# Patient Record
Sex: Male | Born: 1955
Health system: Southern US, Community
[De-identification: ages and names within clinical notes are randomized; demographics above are authoritative.]

## PROBLEM LIST (undated history)

## (undated) DIAGNOSIS — M069 Rheumatoid arthritis, unspecified: Secondary | ICD-10-CM

## (undated) DIAGNOSIS — E785 Hyperlipidemia, unspecified: Secondary | ICD-10-CM

## (undated) DIAGNOSIS — M1990 Unspecified osteoarthritis, unspecified site: Secondary | ICD-10-CM

## (undated) DIAGNOSIS — H9192 Unspecified hearing loss, left ear: Secondary | ICD-10-CM

## (undated) DIAGNOSIS — T4145XA Adverse effect of unspecified anesthetic, initial encounter: Secondary | ICD-10-CM

## (undated) DIAGNOSIS — I252 Old myocardial infarction: Secondary | ICD-10-CM

## (undated) DIAGNOSIS — E669 Obesity, unspecified: Secondary | ICD-10-CM

## (undated) DIAGNOSIS — K829 Disease of gallbladder, unspecified: Secondary | ICD-10-CM

## (undated) DIAGNOSIS — M549 Dorsalgia, unspecified: Secondary | ICD-10-CM

## (undated) DIAGNOSIS — I10 Essential (primary) hypertension: Secondary | ICD-10-CM

## (undated) DIAGNOSIS — Z91018 Allergy to other foods: Secondary | ICD-10-CM

## (undated) DIAGNOSIS — T8859XA Other complications of anesthesia, initial encounter: Secondary | ICD-10-CM

## (undated) DIAGNOSIS — G4733 Obstructive sleep apnea (adult) (pediatric): Secondary | ICD-10-CM

## (undated) DIAGNOSIS — Z9989 Dependence on other enabling machines and devices: Secondary | ICD-10-CM

## (undated) DIAGNOSIS — S22089A Unspecified fracture of T11-T12 vertebra, initial encounter for closed fracture: Secondary | ICD-10-CM

## (undated) DIAGNOSIS — M2550 Pain in unspecified joint: Secondary | ICD-10-CM

## (undated) DIAGNOSIS — K219 Gastro-esophageal reflux disease without esophagitis: Secondary | ICD-10-CM

## (undated) DIAGNOSIS — I2510 Atherosclerotic heart disease of native coronary artery without angina pectoris: Secondary | ICD-10-CM

## (undated) HISTORY — DX: Pain in unspecified joint: M25.50

## (undated) HISTORY — PX: TOTAL HIP ARTHROPLASTY: SHX124

## (undated) HISTORY — DX: Allergy to other foods: Z91.018

## (undated) HISTORY — DX: Hyperlipidemia, unspecified: E78.5

## (undated) HISTORY — DX: Rheumatoid arthritis, unspecified: M06.9

## (undated) HISTORY — DX: Essential (primary) hypertension: I10

## (undated) HISTORY — DX: Old myocardial infarction: I25.2

## (undated) HISTORY — DX: Obesity, unspecified: E66.9

## (undated) HISTORY — DX: Dependence on other enabling machines and devices: Z99.89

## (undated) HISTORY — DX: Dorsalgia, unspecified: M54.9

## (undated) HISTORY — DX: Gastro-esophageal reflux disease without esophagitis: K21.9

## (undated) HISTORY — DX: Unspecified fracture of t11-T12 vertebra, initial encounter for closed fracture: S22.089A

## (undated) HISTORY — PX: CORONARY ANGIOPLASTY: SHX604

## (undated) HISTORY — DX: Disease of gallbladder, unspecified: K82.9

## (undated) HISTORY — DX: Atherosclerotic heart disease of native coronary artery without angina pectoris: I25.10

## (undated) HISTORY — DX: Obstructive sleep apnea (adult) (pediatric): G47.33

## (undated) HISTORY — PX: CARDIAC CATHETERIZATION: SHX172

## (undated) HISTORY — PX: TONSILLECTOMY: SUR1361

## (undated) HISTORY — DX: Unspecified hearing loss, left ear: H91.92

---

## 2004-02-20 ENCOUNTER — Encounter: Admission: RE | Admit: 2004-02-20 | Discharge: 2004-02-20 | Payer: Self-pay | Admitting: Family Medicine

## 2006-11-08 ENCOUNTER — Encounter: Admission: RE | Admit: 2006-11-08 | Discharge: 2006-11-08 | Payer: Self-pay | Admitting: Rheumatology

## 2006-11-23 ENCOUNTER — Encounter: Admission: RE | Admit: 2006-11-23 | Discharge: 2006-11-23 | Payer: Self-pay | Admitting: Orthopaedic Surgery

## 2007-04-19 ENCOUNTER — Encounter: Admission: RE | Admit: 2007-04-19 | Discharge: 2007-04-19 | Payer: Self-pay | Admitting: Family Medicine

## 2008-06-13 ENCOUNTER — Observation Stay (HOSPITAL_COMMUNITY): Admission: AD | Admit: 2008-06-13 | Discharge: 2008-06-14 | Payer: Self-pay | Admitting: Interventional Cardiology

## 2008-06-27 ENCOUNTER — Inpatient Hospital Stay (HOSPITAL_COMMUNITY): Admission: AD | Admit: 2008-06-27 | Discharge: 2008-06-28 | Payer: Self-pay | Admitting: Interventional Cardiology

## 2010-01-30 ENCOUNTER — Emergency Department (HOSPITAL_COMMUNITY): Admission: EM | Admit: 2010-01-30 | Discharge: 2010-01-30 | Payer: Self-pay | Admitting: Emergency Medicine

## 2011-01-08 ENCOUNTER — Encounter: Payer: Self-pay | Admitting: Family Medicine

## 2011-01-09 ENCOUNTER — Encounter: Payer: Self-pay | Admitting: Family Medicine

## 2011-09-15 LAB — BASIC METABOLIC PANEL
CO2: 26
CO2: 26
Calcium: 8.7
Calcium: 8.8
Chloride: 105
Chloride: 106
GFR calc Af Amer: >60
Glucose, Bld: 106 — ABNORMAL HIGH
Sodium: 140
Sodium: 137

## 2011-09-15 LAB — CBC
Hemoglobin: 13.9
Hemoglobin: 13.4
MCHC: 33.6
MCHC: 33.9
MCV: 87.4
MCV: 87.6
RBC: 4.75
RDW: 13

## 2012-04-19 ENCOUNTER — Other Ambulatory Visit: Payer: Self-pay | Admitting: Family Medicine

## 2012-04-19 DIAGNOSIS — M25552 Pain in left hip: Secondary | ICD-10-CM

## 2012-04-27 ENCOUNTER — Ambulatory Visit
Admission: RE | Admit: 2012-04-27 | Discharge: 2012-04-27 | Disposition: A | Discharge status: Home / Self Care | Payer: 59 | Source: Ambulatory Visit | Attending: Family Medicine | Admitting: Family Medicine

## 2012-04-27 DIAGNOSIS — M25552 Pain in left hip: Secondary | ICD-10-CM

## 2012-04-27 MED ORDER — IOHEXOL 180 MG/ML  SOLN: 1.0000 mL | Freq: Once | INTRAMUSCULAR | Status: AC | PRN

## 2012-04-27 MED ORDER — METHYLPREDNISOLONE ACETATE 40 MG/ML INJ SUSP (RADIOLOG: 120.0000 mg | Freq: Once | INTRAMUSCULAR | Status: DC

## 2013-09-03 ENCOUNTER — Other Ambulatory Visit: Payer: Self-pay | Admitting: Cardiology

## 2013-09-03 ENCOUNTER — Other Ambulatory Visit: Payer: Self-pay | Admitting: Interventional Cardiology

## 2013-09-03 DIAGNOSIS — E782 Mixed hyperlipidemia: Secondary | ICD-10-CM

## 2013-09-03 DIAGNOSIS — Z79899 Other long term (current) drug therapy: Secondary | ICD-10-CM

## 2013-09-23 ENCOUNTER — Other Ambulatory Visit (HOSPITAL_BASED_OUTPATIENT_CLINIC_OR_DEPARTMENT_OTHER): Payer: Self-pay | Admitting: Family Medicine

## 2013-09-23 DIAGNOSIS — R1013 Epigastric pain: Secondary | ICD-10-CM

## 2013-09-25 ENCOUNTER — Ambulatory Visit (HOSPITAL_BASED_OUTPATIENT_CLINIC_OR_DEPARTMENT_OTHER): Payer: 59

## 2013-10-02 ENCOUNTER — Ambulatory Visit (HOSPITAL_BASED_OUTPATIENT_CLINIC_OR_DEPARTMENT_OTHER)
Admission: RE | Admit: 2013-10-02 | Discharge: 2013-10-02 | Disposition: A | Discharge status: Home / Self Care | Payer: 59 | Source: Ambulatory Visit | Attending: Family Medicine | Admitting: Family Medicine

## 2013-10-02 DIAGNOSIS — R1013 Epigastric pain: Secondary | ICD-10-CM

## 2013-11-28 ENCOUNTER — Other Ambulatory Visit: Payer: Self-pay | Admitting: Interventional Cardiology

## 2013-12-03 ENCOUNTER — Other Ambulatory Visit: Payer: 59

## 2014-02-01 ENCOUNTER — Other Ambulatory Visit: Payer: Self-pay | Admitting: Interventional Cardiology

## 2014-04-21 ENCOUNTER — Ambulatory Visit (INDEPENDENT_AMBULATORY_CARE_PROVIDER_SITE_OTHER): Payer: 59 | Admitting: Interventional Cardiology

## 2014-04-21 ENCOUNTER — Encounter: Payer: Self-pay | Admitting: Interventional Cardiology

## 2014-04-21 ENCOUNTER — Telehealth: Payer: Self-pay | Admitting: Pharmacist

## 2014-04-21 VITALS — BP 152/90 | HR 60 | Ht 67.0 in | Wt 220.0 lb

## 2014-04-21 DIAGNOSIS — I2510 Atherosclerotic heart disease of native coronary artery without angina pectoris: Secondary | ICD-10-CM

## 2014-04-21 DIAGNOSIS — E669 Obesity, unspecified: Secondary | ICD-10-CM

## 2014-04-21 DIAGNOSIS — I10 Essential (primary) hypertension: Secondary | ICD-10-CM

## 2014-04-21 DIAGNOSIS — E782 Mixed hyperlipidemia: Secondary | ICD-10-CM

## 2014-04-21 NOTE — Progress Notes (Signed)
Patient ID: Brendan Cox, male   DOB: 06/05/1956, [age] y.o.   MRN: [id number]    [address] [place], [place]  [address] Phone: [phone] Fax:  [phone]  Date:  04/21/2014   ID:  Brendan Cox, DOB 02/25/1956, MRN [id number]  PCP:  Lenora BoysFRIED, ROBERT L, MD      History of Present Illness: Brendan Cox is a 58 y.o. male who has had CAD, He had a DES to the LAD in 7/09. He had been walking 5 x/week, for 7000-8000 steps. Arthiritis limits him somewhat and then he had a hip replacement- with some complications postoperatively. He had lost weight through diet control but has gained weight. He is eating more regularly. He tries to eat sandwiches when he is traveling and avoids fried foods. He avoids red meat and processed meat. He has decreased the quantity of his junk food. He is having his hip replaced. BP at home is 128-132 systolic. CAD/ASCVD:  Denies : Chest pain.  Dizziness.  Dyspnea on exertion.  Leg edema.  Nitroglycerin.  Palpitations.  Paroxysmal nocturnal dyspnea.  Syncope.    Had an episode of stomach while mowing the lawn but this resolved with some Mallox.  He has mowed the lawn since then several times without any sx.  Wt Readings from Last 3 Encounters:  04/21/14 220 lb (99.791 kg)     Past Medical History  Diagnosis Date  . Coronary artery disease   . Hearing loss in left ear   . Hyperlipidemia   . Hypertension   . Obesity     Current Outpatient Prescriptions  Medication Sig Dispense Refill  . aspirin 81 MG tablet Take 81 mg by mouth daily.      Marland Kitchen. azelastine (ASTELIN) 137 MCG/SPRAY nasal spray Place 2 sprays into both nostrils 2 (two) times daily. Use in each nostril as directed      . clopidogrel (PLAVIX) 75 MG tablet TAKE 1 TABLET DAILY  90 tablet  1  . co-enzyme Q-10 30 MG capsule Take 200 mg by mouth daily.      . hydrochlorothiazide (HYDRODIURIL) 25 MG tablet Take 25 mg by mouth daily.      Marland Kitchen. HYDROcodone-acetaminophen (NORCO/VICODIN)  5-325 MG per tablet Take 1 tablet by mouth as needed for moderate pain.      . hydrocortisone-pramoxine (ANALPRAM-HC) 2.5-1 % rectal cream Place 1 application rectally 3 (three) times daily.      Marland Kitchen. METOPROLOL SUCCINATE ER PO Take 50 mg by mouth daily.      . montelukast (SINGULAIR) 10 MG tablet Take 10 mg by mouth as needed.      . nitroGLYCERIN (NITROSTAT) 0.4 MG SL tablet Place 0.4 mg under the tongue every 5 (five) minutes as needed for chest pain.      . pantoprazole (PROTONIX) 40 MG tablet TAKE 1 TABLET DAILY  90 tablet  1  . pramoxine-hydrocortisone (ANALPRAM HC) cream Apply topically as needed.      . ranitidine (ZANTAC) 300 MG capsule Take 300 mg by mouth every evening.      . zolpidem (AMBIEN) 10 MG tablet Take 10 mg by mouth at bedtime as needed for sleep.       No current facility-administered medications for this visit.    Allergies:    Allergies  Allergen Reactions  . Diclofenac-Misoprostol Nausea And Vomiting and Nausea Only  . Meperidine Nausea And Vomiting  . Statins      Patient stated that statin  drugs gives him muscle pain and joint pain  . Zetia [Ezetimibe]     Muscle cramps     Social History:  The patient  reports that he has never smoked. He does not have any smokeless tobacco history on file. He reports that he drinks about .6 ounces of alcohol per week. He reports that he does not use illicit drugs.   Family History:  The patient's family history includes Heart disease in his mother.   ROS:  Please see the history of present illness.  No nausea, vomiting.  No fevers, chills.  No focal weakness.  No dysuria. Gaining strength.   All other systems reviewed and negative.   PHYSICAL EXAM: VS:  BP 152/90  Pulse 60  Ht 5\' 7"  (1.702 m)  Wt 220 lb (99.791 kg)  BMI 34.45 kg/m2 Well nourished, well developed, in no acute distress HEENT: normal Neck: no JVD, no carotid bruits Cardiac:  normal S1, S2; RRR;  Lungs:  clear to auscultation bilaterally, no wheezing,  rhonchi or rales Abd: soft, nontender, no hepatomegaly Ext: no edema Skin: warm and dry Neuro:   no focal abnormalities noted  EKG:  NSR, no significant ST segment changes  ; no change from prior noted below  ASSESSMENT AND PLAN:  Coronary atherosclerosis of native coronary artery  Continue Aspirin Tablet, 81 MG, 1 tab, Orally, Once a day Continue Plavix Tablet, 75 MG, 1 tablet, Orally, Once a day IMAGING: EKG    Harward,Amy 04/22/2013 11:01:23 AM > Cecylia Brazill,JAY 04/22/2013 11:35:18 AM > NSR, no ST segment changes   Notes: No angina.    2. Obesity, unspecified  Notes: lost weight with diet control. Trying to get under 200 lbs.  gained a little bit of weight after hip surgery.  Increase exercise as tolerated.   3. Essential hypertension, benign  Notes: Controlled at home. Continue current medicines.    4. Hyperlipidemia, mixed  Notes: Intolerant of welchol. LDL target < 100.  Intolerant of several statins. He was recently started on said he had but stopped this because of muscle pain. He could be a candidate for a PC SK 9 inhibitor trial. Will refer to our Pharm.D.    Preventive Medicine  Adult topics discussed:  Diet: healthy diet, low calorie, low fat.  Exercise: 5 days a week, at least 30 minutes of aerobic exercise.      Signed, Fredric MareJay S. Keylin Podolsky, MD, [facility] 04/21/2014 10:10 AM

## 2014-04-21 NOTE — Telephone Encounter (Signed)
Patient has a h/o CAD (PCI 2009) and intolerant to multiple statins, including Livalo, Crestor, Zocor, pravastatin, and also failed Welchol and Zetia due to muscle aches.  His LDL is ~ 190 mg/dL at baseline.  I discussed PCSK-9 inhibitors with patient, and he would like to get on Amgen or Sanofi's once they hit the market in the next few months.  I will call patient in a few months once they hit the market, and hopefully get him started at that time.

## 2014-04-21 NOTE — Patient Instructions (Signed)
Your physician recommends that you continue on your current medications as directed. Please refer to the Current Medication list given to you today.  Your physician wants you to follow-up in: 1 year with Dr. Varanasi. You will receive a reminder letter in the mail two months in advance. If you don't receive a letter, please call our office to schedule the follow-up appointment.  

## 2014-04-25 ENCOUNTER — Encounter: Payer: Self-pay | Admitting: Interventional Cardiology

## 2014-05-04 ENCOUNTER — Other Ambulatory Visit: Payer: Self-pay | Admitting: Interventional Cardiology

## 2014-07-10 ENCOUNTER — Other Ambulatory Visit: Payer: Self-pay | Admitting: Interventional Cardiology

## 2014-09-18 DIAGNOSIS — Z9989 Dependence on other enabling machines and devices: Secondary | ICD-10-CM

## 2014-09-18 DIAGNOSIS — G4733 Obstructive sleep apnea (adult) (pediatric): Secondary | ICD-10-CM

## 2014-09-18 HISTORY — DX: Obstructive sleep apnea (adult) (pediatric): Z99.89

## 2014-09-18 HISTORY — DX: Obstructive sleep apnea (adult) (pediatric): G47.33

## 2014-10-19 DIAGNOSIS — S22089A Unspecified fracture of T11-T12 vertebra, initial encounter for closed fracture: Secondary | ICD-10-CM

## 2014-10-19 HISTORY — DX: Unspecified fracture of t11-T12 vertebra, initial encounter for closed fracture: S22.089A

## 2014-10-31 ENCOUNTER — Other Ambulatory Visit: Payer: Self-pay | Admitting: Interventional Cardiology

## 2014-11-19 ENCOUNTER — Other Ambulatory Visit: Payer: Self-pay | Admitting: Family Medicine

## 2014-11-19 DIAGNOSIS — W11XXXA Fall on and from ladder, initial encounter: Secondary | ICD-10-CM

## 2014-11-19 DIAGNOSIS — S300XXA Contusion of lower back and pelvis, initial encounter: Secondary | ICD-10-CM

## 2014-11-24 ENCOUNTER — Ambulatory Visit (INDEPENDENT_AMBULATORY_CARE_PROVIDER_SITE_OTHER): Payer: 59

## 2014-11-24 DIAGNOSIS — M4802 Spinal stenosis, cervical region: Secondary | ICD-10-CM

## 2014-11-24 DIAGNOSIS — W11XXXD Fall on and from ladder, subsequent encounter: Secondary | ICD-10-CM

## 2014-11-24 DIAGNOSIS — W11XXXA Fall on and from ladder, initial encounter: Secondary | ICD-10-CM

## 2014-11-24 DIAGNOSIS — S300XXA Contusion of lower back and pelvis, initial encounter: Secondary | ICD-10-CM

## 2014-11-24 DIAGNOSIS — M5136 Other intervertebral disc degeneration, lumbar region: Secondary | ICD-10-CM

## 2014-11-24 DIAGNOSIS — M4806 Spinal stenosis, lumbar region: Secondary | ICD-10-CM

## 2014-11-24 DIAGNOSIS — S22080A Wedge compression fracture of T11-T12 vertebra, initial encounter for closed fracture: Secondary | ICD-10-CM

## 2014-11-24 DIAGNOSIS — M5022 Other cervical disc displacement, mid-cervical region: Secondary | ICD-10-CM

## 2014-11-24 DIAGNOSIS — M5126 Other intervertebral disc displacement, lumbar region: Secondary | ICD-10-CM

## 2015-04-06 ENCOUNTER — Other Ambulatory Visit: Payer: Self-pay | Admitting: Interventional Cardiology

## 2015-05-04 ENCOUNTER — Encounter: Payer: Self-pay | Admitting: Interventional Cardiology

## 2015-05-04 ENCOUNTER — Ambulatory Visit (INDEPENDENT_AMBULATORY_CARE_PROVIDER_SITE_OTHER): Payer: 59 | Admitting: Interventional Cardiology

## 2015-05-04 VITALS — BP 144/86 | HR 62 | Ht 67.5 in | Wt 220.8 lb

## 2015-05-04 DIAGNOSIS — E669 Obesity, unspecified: Secondary | ICD-10-CM

## 2015-05-04 DIAGNOSIS — I25119 Atherosclerotic heart disease of native coronary artery with unspecified angina pectoris: Secondary | ICD-10-CM

## 2015-05-04 DIAGNOSIS — I10 Essential (primary) hypertension: Secondary | ICD-10-CM

## 2015-05-04 DIAGNOSIS — I2510 Atherosclerotic heart disease of native coronary artery without angina pectoris: Secondary | ICD-10-CM | POA: Diagnosis not present

## 2015-05-04 DIAGNOSIS — E782 Mixed hyperlipidemia: Secondary | ICD-10-CM | POA: Diagnosis not present

## 2015-05-04 MED ORDER — CLOPIDOGREL BISULFATE 75 MG PO TABS: 75.0000 mg | ORAL_TABLET | Freq: Every day | ORAL | Status: DC

## 2015-05-04 NOTE — Patient Instructions (Signed)
Medication Instructions: - none  Labwork: - none  Procedures/Testing: - Your physician has requested that you have an exercise stress myoview. Please call our office back at [phone] when you are ready to schedule this.   Follow-Up: Your physician wants you to follow-up in: 4 months with Dr. Eldridge DaceVaranasi. You will receive a reminder letter in the mail two months in advance. If you don't receive a letter, please call our office to schedule the follow-up appointment.  Any Additional Special Instructions Will Be Listed Below (If Applicable). - none

## 2015-05-04 NOTE — Progress Notes (Signed)
Patient ID: Brendan Cox, male   DOB: 04/16/1956, 59 y.o.   MRN: [id number]     Cardiology Office Note   Date:  05/04/2015   ID:  Brendan Cox, DOB 11/13/1956, MRN [id number]  PCP:  Lenora BoysFRIED, ROBERT L, MD    No chief complaint on file. CAD, heart burn   Wt Readings from Last 3 Encounters:  05/04/15 220 lb 12.8 oz (100.154 kg)  11/24/14 220 lb (99.791 kg)  11/24/14 220 lb (99.791 kg)       History of Present Illness: Brendan Cox is a 59 y.o. male  who has had CAD, arthritis (bilateral hip replacements). He had a DES to the LAD in 7/09. He had been walking 5 x/week, for 7000-8000 steps. Larey SeatFell of the roof and had a T12 fracture.  His walking has decreased.  Got out of a brace in March 2016.  Just now increasing his walking to 7-10K steps /day.  He is trying to get back into shape.    He had lost weight through diet control. Weight has been stable during the past few months.   He is eating more regularly. He tries to eat sandwiches when he is traveling and avoids fried foods. He avoids red meat and processed meat. He has decreased the quantity of his junk food. He is having his hip replaced. BP at home is 128-132 systolic. CAD/ASCVD:  Denies : Chest pain.  Dizziness.  Dyspnea on exertion.  Leg edema.  Nitroglycerin.  Palpitations.  Paroxysmal nocturnal dyspnea.  Syncope.    Had an episode of stomach discomfort while mowing the lawn.  It was similar to his prior angina. He had an ECG with his PMD which was ok. There were other times that he mowed the lawn and had no problems.       Past Medical History  Diagnosis Date  . Coronary artery disease   . Hearing loss in left ear   . Hyperlipidemia   . Hypertension   . Obesity     No past surgical history on file.   Current Outpatient Prescriptions  Medication Sig Dispense Refill  . aspirin 81 MG tablet Take 81 mg by mouth daily.    Marland Kitchen. azelastine (ASTELIN) 137 MCG/SPRAY nasal spray Place 2 sprays into both nostrils  2 (two) times daily. Use in each nostril as directed    . cetirizine (ZYRTEC) 10 MG tablet Take 10 mg by mouth daily.    . clopidogrel (PLAVIX) 75 MG tablet TAKE 1 TABLET DAILY 90 tablet 0  . co-enzyme Q-10 30 MG capsule Take 200 mg by mouth daily.    . hydrochlorothiazide (HYDRODIURIL) 25 MG tablet Take 25 mg by mouth daily.    Marland Kitchen. HYDROcodone-acetaminophen (NORCO/VICODIN) 5-325 MG per tablet Take 1 tablet by mouth as needed for moderate pain.    . hydrocortisone-pramoxine (ANALPRAM-HC) 2.5-1 % rectal cream Place 1 application rectally 3 (three) times daily.    . hyoscyamine (LEVSIN SL) 0.125 MG SL tablet Place 0.125 mg under the tongue 4 (four) times daily as needed. Abdominal pain  1  . METOPROLOL SUCCINATE ER PO Take 50 mg by mouth daily.    . nitroGLYCERIN (NITROSTAT) 0.4 MG SL tablet Place 0.4 mg under the tongue every 5 (five) minutes as needed for chest pain.    . pantoprazole (PROTONIX) 40 MG tablet TAKE 1 TABLET DAILY 90 tablet 3  . pramoxine-hydrocortisone (ANALPRAM HC) cream Apply topically as needed.    . ranitidine (ZANTAC) 300 MG capsule  Take 300 mg by mouth every evening.    . zolpidem (AMBIEN) 10 MG tablet Take 10 mg by mouth at bedtime as needed for sleep.     No current facility-administered medications for this visit.    Allergies:   Diclofenac-misoprostol; Meperidine; Statins; and Zetia    Social History:  The patient  reports that he has never smoked. He does not have any smokeless tobacco history on file. He reports that he drinks about 0.6 oz of alcohol per week. He reports that he does not use illicit drugs.   Family History:  The patient's *family history includes Healthy in his brother and brother; Heart disease in his mother; Kidney failure in his father.    ROS:  Please see the history of present illness.   Otherwise, review of systems are positive for exertional heart burn at times.   All other systems are reviewed and negative.    PHYSICAL EXAM: VS:  BP  144/86 mmHg  Pulse 62  Ht 5' 7.5" (1.715 m)  Wt 220 lb 12.8 oz (100.154 kg)  BMI 34.05 kg/m2  SpO2 96% , BMI Body mass index is 34.05 kg/(m^2). GEN: Well nourished, well developed, in no acute distress HEENT: normal Neck: no JVD, carotid bruits, or masses Cardiac: RRR; no murmurs, rubs, or gallops,no edema  Respiratory:  clear to auscultation bilaterally, normal work of breathing GI: soft, nontender, nondistended, + BS MS: no deformity or atrophy Skin: warm and dry, no rash Neuro:  Strength and sensation are intact Psych: euthymic mood, full affect     Recent Labs: No results found for requested labs within last 365 days.   Lipid Panel No results found for: CHOL, TRIG, HDL, CHOLHDL, VLDL, LDLCALC, LDLDIRECT   Other studies Reviewed: Additional studies/ records that were reviewed today with results demonstrating: Normal resting ECG on 03/30/15.   ASSESSMENT AND PLAN:  1. CAD: s/p bifurcation PCI in 2009 of the LA diagonal.  Refill plavix 75 mg daily.  2. Angina/heart burn: Plan for nuclear stress test.  He wants to wait a month.  He will call and give us a date that works for him at that time.   3. Hyperlipidemia: LDL 162 most recently.  Intolerant of mulktiple statins and zetia due to myalgias.  Given CAD, ? Whether he would be a candidate for a PCSK-9 inhibitor,  WIll check with pharm D.  4. HTN: Stable: COntinue current meds. 5. Obesity: Trying to get back to regular exercise after his most recent injury to his back.   Current medicines are reviewed at length with the patient today.  The patient concerns regarding his medicines were addressed.  The following changes have been made:  No change  Labs/ tests ordered today include:   Orders Placed This Encounter  Procedures  . Myocardial Perfusion Imaging    Recommend 150 minutes/week of aerobic exercise Low fat, low carb, high fiber diet recommended  Disposition:   FU in 4 months   Delorise JacksonSigned, Abigail Marsiglia S.,  MD  05/04/2015 9:42 AM    [facility] [address], [place], [place]  [address] Phone: [phone]([phone]; Fax: [phone]

## 2015-06-11 ENCOUNTER — Telehealth (HOSPITAL_COMMUNITY): Payer: Self-pay | Admitting: *Deleted

## 2015-06-11 NOTE — Telephone Encounter (Signed)
Left message on voicemail in reference to upcoming appointment scheduled for 06/16/15. Phone number given for a call back so details instructions can be given. Carolle Ishii J Nechuma Boven, RN 

## 2015-06-12 ENCOUNTER — Telehealth (HOSPITAL_COMMUNITY): Payer: Self-pay

## 2015-06-12 NOTE — Telephone Encounter (Signed)
Left message with patient's wife in reference to upcoming appointment scheduled for 06-12-2015. Phone number given for a call back so details instructions can be given. Randa Evens, Taesha Goodell A

## 2015-06-12 NOTE — Telephone Encounter (Signed)
Patient called and he is driving. The patient ask me to give the instructions to his wife.  Wife given detailed instructions per Myocardial Perfusion Study Information Sheet for test on 06-16-2015 at 8:30am. Patient's wife notified to arrive 15 minutes early, and that it is imperative to arrive on time for appointment to keep from having the test rescheduled. Patient's wife verbalized understanding. Randa Evens, Arael Piccione A

## 2015-06-16 ENCOUNTER — Ambulatory Visit (HOSPITAL_COMMUNITY): Payer: 59 | Attending: Internal Medicine

## 2015-06-16 DIAGNOSIS — I2510 Atherosclerotic heart disease of native coronary artery without angina pectoris: Secondary | ICD-10-CM | POA: Diagnosis not present

## 2015-06-16 LAB — MYOCARDIAL PERFUSION IMAGING
CHL CUP MPHR: 159 {beats}/min
CHL CUP NUCLEAR SSS: 5
CHL CUP RESTING HR STRESS: 52 {beats}/min
CHL RATE OF PERCEIVED EXERTION: 17
CSEPED: 9 min
CSEPEDS: 15 s
Estimated workload: 10.1 METS
LV dias vol: 109 mL
LVSYSVOL: 45 mL
NUC STRESS TID: 0.85
Peak HR: 137 {beats}/min
Percent HR: 86 %
RATE: 0.34
SDS: 2
SRS: 3

## 2015-06-16 MED ORDER — TECHNETIUM TC 99M SESTAMIBI GENERIC - CARDIOLITE
30.0000 | Freq: Once | INTRAVENOUS | Status: AC | PRN
  Administered 2015-06-16: 32.3 via INTRAVENOUS

## 2015-06-16 MED ORDER — TECHNETIUM TC 99M SESTAMIBI GENERIC - CARDIOLITE
10.0000 | Freq: Once | INTRAVENOUS | Status: AC | PRN
  Administered 2015-06-16: 10.3 via INTRAVENOUS

## 2015-06-18 ENCOUNTER — Telehealth: Payer: Self-pay | Admitting: *Deleted

## 2015-06-18 NOTE — Telephone Encounter (Signed)
-----   Message from Corky CraftsJayadeep S Varanasi, MD sent at 06/16/2015  5:41 PM EDT ----- Low risk stress test.  If his chest burning persists, he should let us know.  Given his prior stents, would have a low threshold for cardiac cath.

## 2015-07-02 ENCOUNTER — Other Ambulatory Visit: Payer: Self-pay | Admitting: Gastroenterology

## 2015-07-02 DIAGNOSIS — R1013 Epigastric pain: Secondary | ICD-10-CM

## 2015-07-10 ENCOUNTER — Ambulatory Visit
Admission: RE | Admit: 2015-07-10 | Discharge: 2015-07-10 | Disposition: A | Discharge status: Home / Self Care | Payer: 59 | Source: Ambulatory Visit | Attending: Gastroenterology | Admitting: Gastroenterology

## 2015-07-10 DIAGNOSIS — R1013 Epigastric pain: Secondary | ICD-10-CM

## 2015-07-13 ENCOUNTER — Other Ambulatory Visit (HOSPITAL_COMMUNITY): Payer: Self-pay | Admitting: Gastroenterology

## 2015-07-13 DIAGNOSIS — R1084 Generalized abdominal pain: Secondary | ICD-10-CM

## 2015-07-31 ENCOUNTER — Ambulatory Visit (HOSPITAL_COMMUNITY)
Admission: RE | Admit: 2015-07-31 | Discharge: 2015-07-31 | Disposition: A | Discharge status: Home / Self Care | Payer: 59 | Source: Ambulatory Visit | Attending: Gastroenterology | Admitting: Gastroenterology

## 2015-07-31 DIAGNOSIS — R1084 Generalized abdominal pain: Secondary | ICD-10-CM | POA: Diagnosis not present

## 2015-07-31 DIAGNOSIS — R932 Abnormal findings on diagnostic imaging of liver and biliary tract: Secondary | ICD-10-CM | POA: Insufficient documentation

## 2015-07-31 DIAGNOSIS — R110 Nausea: Secondary | ICD-10-CM | POA: Diagnosis not present

## 2015-07-31 MED ORDER — TECHNETIUM TC 99M MEBROFENIN IV KIT: 5.4000 | PACK | Freq: Once | INTRAVENOUS | Status: DC | PRN

## 2015-08-18 ENCOUNTER — Other Ambulatory Visit: Payer: Self-pay | Admitting: General Surgery

## 2015-08-21 ENCOUNTER — Telehealth: Payer: Self-pay | Admitting: Interventional Cardiology

## 2015-09-19 NOTE — Pre-Procedure Instructions (Signed)
Brendan Cox  09/19/2015      [facility] - [place], [place] - [address] [place] [address] Phone: [phone] Fax: [phone]    Your procedure is scheduled on Mon, Oct 10 @ 7:30 AM  Report to [facility] Admitting at 5:30 AM  Call this number if you have problems the morning of surgery:  [phone]   Remember:  Do not eat food or drink liquids after midnight.  Take these medicines the morning of surgery with A SIP OF WATER: Astelin, Zyrtec (if needed), Dexilant, Hydrocodone (if needed), Levsin, Metoprolol,    STOP Plavix, Asprin, CoQ 10, October 5   STOP/ Do not take Aspirin, Aleve, Naproxen, Advil, Ibuprofen, Motrin, Vitamins, Herbs, or Supplements starting October 5                  Do not wear jewelry.  Do not wear lotions, powders, or colognes.  You may wear deodorant.             Men may shave face and neck.  Do not bring valuables to the hospital.  [facility] is not responsible for any belongings or valuables.  Contacts, dentures or bridgework may not be worn into surgery.  Leave your suitcase in the car.  After surgery it may be brought to your room.  For patients admitted to the hospital, discharge time will be determined by your treatment team.  Patients discharged the day of surgery will not be allowed to drive home.    [facility] - Preparing for Surgery  Before surgery, you can play an important role.  Because skin is not sterile, your skin needs to be as free of germs as possible.  You can reduce the number of germs on you skin by washing with CHG (chlorahexidine gluconate) soap before surgery.  CHG is an antiseptic cleaner which kills germs and bonds with the skin to continue killing germs even after washing.  Please DO NOT use if you have an allergy to CHG or antibacterial soaps.  If your skin becomes reddened/irritated stop using the CHG and inform your nurse when you arrive at Short  Stay.  Do not shave (including legs and underarms) for at least 48 hours prior to the first CHG shower.  You may shave your face.  Please follow these instructions carefully:   1.  Shower with CHG Soap the night before surgery and the morning of Surgery.  2.  If you choose to wash your hair, wash your hair first as usual with your normal shampoo.  3.  After you shampoo, rinse your hair and body thoroughly to remove the shampoo.  4.  Use CHG as you would any other liquid soap.  You can apply CHG directly to the skin and wash gently with scrungie or a clean washcloth.  5.  Apply the CHG Soap to your body ONLY FROM THE NECK DOWN.  Do not use on open wounds or open sores.  Avoid contact with your eyes, ears, mouth and genitals (private parts).  Wash genitals (private parts) with your normal soap.  6.  Wash thoroughly, paying special attention to the area where your surgery will be performed.  7.  Thoroughly rinse your body with warm water from the neck down.  8.  DO NOT shower/wash with your normal soap after using and rinsing off the CHG Soap.  9.  Pat yourself dry with a clean towel.  10.  Wear clean pajamas.            11.  Place clean sheets on your bed the night of your first shower and do not sleep with pets.  Day of Surgery  Do not apply any lotions the morning of surgery.  Please wear clean clothes to the hospital/surgery center.   Please read over the following fact sheets that you were given. Pain Booklet, Coughing and Deep Breathing and Surgical Site Infection Prevention

## 2015-09-21 ENCOUNTER — Encounter (HOSPITAL_COMMUNITY)
Admission: RE | Admit: 2015-09-21 | Discharge: 2015-09-21 | Disposition: A | Discharge status: Home / Self Care | Payer: 59 | Source: Ambulatory Visit | Attending: General Surgery | Admitting: General Surgery

## 2015-09-21 ENCOUNTER — Telehealth: Payer: Self-pay | Admitting: Interventional Cardiology

## 2015-09-21 ENCOUNTER — Encounter (HOSPITAL_COMMUNITY): Payer: Self-pay

## 2015-09-21 DIAGNOSIS — Z01818 Encounter for other preprocedural examination: Secondary | ICD-10-CM | POA: Insufficient documentation

## 2015-09-21 DIAGNOSIS — K811 Chronic cholecystitis: Secondary | ICD-10-CM | POA: Diagnosis not present

## 2015-09-21 HISTORY — DX: Unspecified osteoarthritis, unspecified site: M19.90

## 2015-09-21 HISTORY — DX: Adverse effect of unspecified anesthetic, initial encounter: T41.45XA

## 2015-09-21 HISTORY — DX: Other complications of anesthesia, initial encounter: T88.59XA

## 2015-09-21 LAB — CBC WITH DIFFERENTIAL/PLATELET
BASOS PCT: 1 %
Basophils Absolute: 0.1 10*3/uL (ref 0.0–0.1)
EOS ABS: 0.4 10*3/uL (ref 0.0–0.7)
EOS PCT: 5 %
HCT: 44 % (ref 39.0–52.0)
HEMOGLOBIN: 14.6 g/dL (ref 13.0–17.0)
LYMPHS ABS: 2.1 10*3/uL (ref 0.7–4.0)
Lymphocytes Relative: 24 %
MCH: 29.1 pg (ref 26.0–34.0)
MCHC: 33.2 g/dL (ref 30.0–36.0)
MCV: 87.8 fL (ref 78.0–100.0)
Monocytes Absolute: 0.6 10*3/uL (ref 0.1–1.0)
Monocytes Relative: 7 %
NEUTROS PCT: 63 %
Neutro Abs: 5.4 10*3/uL (ref 1.7–7.7)
PLATELETS: 239 10*3/uL (ref 150–400)
RBC: 5.01 MIL/uL (ref 4.22–5.81)
RDW: 13.2 % (ref 11.5–15.5)
WBC: 8.6 10*3/uL (ref 4.0–10.5)

## 2015-09-21 LAB — COMPREHENSIVE METABOLIC PANEL
ALBUMIN: 3.7 g/dL (ref 3.5–5.0)
ALK PHOS: 58 U/L (ref 38–126)
ALT: 22 U/L (ref 17–63)
ANION GAP: 7 (ref 5–15)
AST: 26 U/L (ref 15–41)
BUN: 19 mg/dL (ref 6–20)
CHLORIDE: 106 mmol/L (ref 101–111)
CO2: 27 mmol/L (ref 22–32)
Calcium: 9.3 mg/dL (ref 8.9–10.3)
Creatinine, Ser: 0.92 mg/dL (ref 0.61–1.24)
GFR calc non Af Amer: >60 mL/min (ref >60)
GLUCOSE: 105 mg/dL — AB (ref 65–99)
Potassium: 3.7 mmol/L (ref 3.5–5.1)
SODIUM: 140 mmol/L (ref 135–145)
Total Bilirubin: 0.8 mg/dL (ref 0.3–1.2)
Total Protein: 6.2 g/dL — ABNORMAL LOW (ref 6.5–8.1)

## 2015-09-21 LAB — LIPASE, BLOOD: Lipase: 22 U/L (ref 22–51)

## 2015-09-21 NOTE — Telephone Encounter (Signed)
New message   Short Stay calling  Stating  patient is still having some chest burning - which patient think it might be coming from his gallbladder   Request for surgical clearance:  1. What type of surgery is being performed? Gallbladder   2. When is this surgery scheduled? 10.10.2016   3. Are there any medications that need to be held prior to surgery and how long? Plavix -   4. Name of physician performing surgery? Dr. Barbra Sarks   5. What is your office phone and fax number? [phone]

## 2015-09-21 NOTE — Progress Notes (Signed)
During PAT visit patient reports that he is still having chest burning,however he states that he is not getting symptoms with exertion. Denies shob. Spoke to [place], [place] by phone she requested I contact office regarding same. I left message as nurse was not available and requested cardiac clearance. I also notified them of the symptoms patient reported during PAT visit.

## 2015-09-21 NOTE — Telephone Encounter (Signed)
**Note De-identified Gill Delrossi Obfuscation** Please advise 

## 2015-09-22 ENCOUNTER — Encounter (HOSPITAL_COMMUNITY): Payer: Self-pay

## 2015-09-22 NOTE — Telephone Encounter (Signed)
No further cardiac testing needed at this time.  He can hold Plavix for 5 days prior to procedure.

## 2015-09-22 NOTE — Telephone Encounter (Signed)
Note has been faxed to Dr Chales Salmon office at [phone]. I did receive a conformation that the fax went through successfully.

## 2015-09-22 NOTE — Progress Notes (Signed)
Anesthesia Chart Review: Patient is a 59 year old male scheduled for cholecystectomy on 09/28/15 by Dr. Derrell Lolling. Patient with known CAD. He had been having heartburn and had a low risk stress test in 05/2015. Testing for gall bladder disease showed normal abdominal ultrasound but decreased gallbladder ejection fraction as can be seen with biliary dyskinesia.  History includes non-smoker, CAD s/p DES to LAD 06/2008, HTN, OSA on CPAP, hearing loss left ear, HLD, T12 burst fracture '15, bilateral THA. Epidural for left THA was not effective. PCP is Dr. Marinda Elk.   Meds include ASA 81 mg, Zyrtec, Plavix, Dexilant, HCTZ, Norco, Levsin, Xalatan, Linzess, Toprol XL, Nasonex, Nitro, Zanaflex, Ambien. Patient to hold Plavix starting 09/23/15.   03/30/15 EKG: SB at 57 bpm.  06/16/15 Nuclear stress test:  Nuclear stress EF: 59%. The ejection fraction is 59%. There is very slight relative hypokinesis of the inferior wall. The images raise the question of a very small area of slight inferolateral scar. There is no definite ischemia. This is a low risk scan. Reviewed by Dr. Eldridge Dace who wrote, "Low risk stress test. If his chest burning persists, he should let us know. Given his prior stents, would have a low threshold for cardiac cath."  During his PAT visit patient reported still with chest burning, but not with exertion and no SOB. I was not in the office when patient had his PAT appointment, so I did not see the patient. I have been in communication with Dr. Eldridge Dace about patient's symptoms and surgery plans. Dr. Eldridge Dace responded to my staff message today stating, "I think he can have surgery. His stress test was low risk. No need for a visit with [place]."  Preoperative labs noted.   Patient's cardiologist is aware of surgery plans as discussed above. Unless patient has new or progressive CV symptoms then I would anticipate that he could proceed as planned.  Velna Ochs [facility] Short Stay  Center/Anesthesiology Phone [phone] 09/22/2015 1:52 PM

## 2015-09-27 NOTE — H&P (Signed)
Brendan Cox  Location: [facility] Patient #: [id number] DOB: 1956-12-13 Married / Language: English / Race: White Male       History of Present Illness  . The patient is a 59 year old male who presents with a complaint of chronic cholecystitis and biliary dyskinesia. This is a very pleasant 59 year old Caucasian man, here with his wife. He is referred by Dr. Carman Ching for consideration of cholecystectomy. Dr. Foy Guadalajara is his PCP. Dr. Eldridge Dace is his cardiologist.  He gives a 3-4 year history of intermittent episodes of epigastric pain, belching, back pain. These are becoming more frequent and more troublesome. It is sometimes postprandial but sometimes not. He especially notices symptoms exacerbated by onions, milk products, and greasy foods. He does not have any trouble swallowing and there is no pain with swallowing. He's been on Dexilant and that did not help. His bowel movements have been pretty normal. The pain attacks start about 20 minutes after meals, when they occur after meals. Ultrasound in 2015 was normal. Ultrasound on July 09, 2049 16 is also normal. Hepatobiliary scan shows visualization of the gallbladder but the ejection fraction is reduced to 17%, normal being 40%. He's had cardiac evaluation recently and a stress test is negative. He is not having any chest pain  Past history is significant for coronary artery disease. Multiple stents placed in 2010. On Plavix and aspirin. Hypertension. Sleep apnea uses CPAP intermittently. Never had a colonoscopy.   Family history reveals father had renal failure diabetes but also had cholecystectomy. Mother died of myocardial infarction. Also had diverticulosis.  He is married with 4 children. 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 Does not smoke. Rare alcohol.  We talked a long time about his workup to date and his symptoms. I told him that it was very likely that this was due to his  gallbladder and very likely that his symptoms would resolve, although I could not be completely sure. He wants to go ahead with gallbladder surgery and wants to do this at [facility], because his daughter works there as a Engineer, civil (consulting). We discussed the indications, details, techniques, and numerous risk of gallbladder surgery with him and his wife. He is aware of the risk of bleeding, infection, conversion to open laparotomy, wound hernia, bile leak, injury to adjacent organs with major reconstructive surgery, cardiac pulmonary and, thromboembolic problems. He understands all these issues well. At this time all of his questions were answered. He agrees with this plan.   He knows to stop his aspirin and Plavix 5 days preop if approved by Dr. Eldridge Dace   Other Problems Arthritis Back Pain Gastroesophageal Reflux Disease Hemorrhoids High blood pressure Hypercholesterolemia Myocardial infarction  Past Surgical History Hip Surgery Bilateral.  Diagnostic Studies History Colonoscopy never  Allergies Statins Diclofenac *ANALGESICS - ANTI-INFLAMMATORY*  Medication History  Hydrocodone-Acetaminophen (5-325MG  Tablet, Oral) Active. Zolpidem Tartrate (  Tablet, Oral) Active. Clopidogrel Bisulfate (  Tablet, Oral) Active. Dexilant (  Capsule DR, Oral) Active. Hydrochlorothiazide (  Tablet, Oral) Active. Hyoscyamine Sulfate (0.125MG  Tab Sublingual, Sublingual) Active. PreviDent 5000 Booster Plus (1.1% Paste, Dental) Active. Sucralfate (1GM Tablet, Oral) Active. Toprol XL (  Tablet ER 24HR, Oral) Active. Co Q 10 (  Capsule, Oral) Active. Metamucil (28.3% Powder, Oral) Active. Nasonex (50MCG/ACT Suspension, Nasal) Active. Astepro (0.15% Solution, Nasal) Active. Aspirin (  Tablet DR, Oral) Active. Levsin/SL (0.125MG  Tab Sublingual, Sublingual) Active. Nitroglycerin (0.4MG  Tab Sublingual, Sublingual) Active. Linzess ( Capsule, Oral)  Active. Medications Reconciled  Social History  Alcohol use Occasional alcohol use. Caffeine use  Coffee. Tobacco use Never smoker.  Family History  Diabetes Mellitus Father. Heart Disease Father, Mother. Heart disease in male family member before age 44 Hypertension Father, Mother.  Review of Systems  General Not Present- Appetite Loss, Chills, Fatigue, Fever, Night Sweats, Weight Gain and Weight Loss. Skin Not Present- Change in Wart/Mole, Dryness, Hives, Jaundice, New Lesions, Non-Healing Wounds, Rash and Ulcer. HEENT Present- Seasonal Allergies and Sinus Pain. Not Present- Earache, Hearing Loss, Hoarseness, Nose Bleed, Oral Ulcers, Ringing in the Ears, Sore Throat, Visual Disturbances, Wears glasses/contact lenses and Yellow Eyes. Respiratory Not Present- Bloody sputum, Chronic Cough, Difficulty Breathing, Snoring and Wheezing. Breast Not Present- Breast Mass, Breast Pain, Nipple Discharge and Skin Changes. Gastrointestinal Present- Abdominal Pain, Bloating and Excessive gas. Not Present- Bloody Stool, Change in Bowel Habits, Chronic diarrhea, Constipation, Difficulty Swallowing, Gets full quickly at meals, Hemorrhoids, Indigestion, Nausea, Rectal Pain and Vomiting. Male Genitourinary Not Present- Blood in Urine, Change in Urinary Stream, Frequency, Impotence, Nocturia, Painful Urination, Urgency and Urine Leakage. Musculoskeletal Present- Joint Pain and Joint Stiffness. Not Present- Back Pain, Muscle Pain, Muscle Weakness and Swelling of Extremities. Neurological Not Present- Decreased Memory, Fainting, Headaches, Numbness, Seizures, Tingling, Tremor, Trouble walking and Weakness. Psychiatric Not Present- Anxiety, Bipolar, Change in Sleep Pattern, Depression, Fearful and Frequent crying. Endocrine Not Present- Cold Intolerance, Excessive Hunger, Hair Changes, Heat Intolerance, Hot flashes and New Diabetes. Hematology Present- Easy Bruising. Not Present- Excessive bleeding,  Gland problems, HIV and Persistent Infections.   Vitals  Weight: 212 lb Height: 67.5in Body Surface Area: 2.14 m Body Mass Index: 32.71 kg/m Pulse: 84 (Regular)  BP: 130/88 (Sitting, Left Arm, Standard)    Physical Exam General Mental Status-Alert. General Appearance-Consistent with stated age. Hydration-Well hydrated. Voice-Normal. Note: Very pleasant. Cooperative. Wife is with him throughout the encounter.   Head and Neck Head-normocephalic, atraumatic with no lesions or palpable masses. Trachea-midline. Thyroid Gland Characteristics - normal size and consistency.  Eye Eyeball - Bilateral-Extraocular movements intact. Sclera/Conjunctiva - Bilateral-No scleral icterus.  Chest and Lung Exam Chest and lung exam reveals -quiet, even and easy respiratory effort with no use of accessory muscles and on auscultation, normal breath sounds, no adventitious sounds and normal vocal resonance. Inspection Chest Wall - Normal. Back - normal.  Cardiovascular Cardiovascular examination reveals -normal heart sounds, regular rate and rhythm with no murmurs and normal pedal pulses bilaterally.  Abdomen Inspection Inspection of the abdomen reveals - No Hernias. Skin - Scar - no surgical scars. Palpation/Percussion Palpation and Percussion of the abdomen reveal - Soft, No Rebound tenderness, No Rigidity (guarding) and No hepatosplenomegaly. Auscultation Auscultation of the abdomen reveals - Bowel sounds normal. Note: Subjectively tender epigastrium and right upper quadrant and to percussion right costal margin. Not dramatic. No guarding. No mass. No scars. No hernias.   Neurologic Neurologic evaluation reveals -alert and oriented x 3 with no impairment of recent or remote memory. Mental Status-Normal.  Musculoskeletal Normal Exam - Left-Upper Extremity Strength Normal and Lower Extremity Strength Normal. Normal Exam - Right-Upper Extremity  Strength Normal and Lower Extremity Strength Normal.  Lymphatic Head & Neck  General Head & Neck Lymphatics: Bilateral - Description - Normal. Axillary  General Axillary Region: Bilateral - Description - Normal. Tenderness - Non Tender. Femoral & Inguinal  Generalized Femoral & Inguinal Lymphatics: Bilateral - Description - Normal. Tenderness - Non Tender.    Assessment & Plan  CHRONIC CHOLECYSTITIS (575.11  K81.1) Current Plans  You are being scheduled for surgery - Our schedulers will call you.  Your  symptoms are very typical for accelerating gallbladder attacks, or biliary colic Her ultrasound does not show stones but sure biliary scan shows reduced function of your gallbladder Your recent cardiac workup and stress test was negative. Most likely your gallbladder is causing her problems Although we cannot be 100% certain, there is a good likelihood that your symptoms will resolve following gallbladder surgery. We have discussed the indications, techniques, and numerous risk of this surgery in detail. Please read the written information that we have given you. He will need to stop her aspirin and Plavix 5 days preop, as discussed     You should hear from our office's scheduling department within 5 working days about the location, date, and time of surgery. We try to make accommodations for patient's preferences in scheduling surgery, but sometimes the OR schedule or the surgeon's schedule prevents [place] from making those accommodations.  If you have not heard from our office [phone]) in 5 working days, call the office and ask for your surgeon's nurse.  If you have other questions about your diagnosis, plan, or surgery, call the office and ask for your surgeon's nurse.  Pt Education - Laparoscopic Cholecystectomy: gallbladder Pt Education - [facility] Laparosopic Post Op HCI (Gross)   PRESENCE OF STENT IN CORONARY ARTERY IN PATIENT WITH CORONARY ARTERY DISEASE (414.01   I25.10) Impression: several coronary stents placed 2010  SLEEP APNEA IN ADULT (327.23  G47.33) Impression: Uses CPAP intermittently  BENIGN HYPERTENSION (401.1  I10)  PLATELET INHIBITION DUE TO PLAVIX (287.49  D69.59)    Angelia Mould. Derrell Lolling, M.D., [facility] General and Minimally invasive Surgery Breast and Colorectal Surgery Office:   [phone] Pager:   [phone]

## 2015-09-28 ENCOUNTER — Ambulatory Visit (HOSPITAL_COMMUNITY): Payer: 59

## 2015-09-28 ENCOUNTER — Ambulatory Visit (HOSPITAL_COMMUNITY): Payer: 59 | Admitting: Vascular Surgery

## 2015-09-28 ENCOUNTER — Ambulatory Visit (HOSPITAL_COMMUNITY): Payer: 59 | Admitting: Certified Registered Nurse Anesthetist

## 2015-09-28 ENCOUNTER — Encounter (HOSPITAL_COMMUNITY): Payer: Self-pay | Admitting: Surgery

## 2015-09-28 ENCOUNTER — Encounter (HOSPITAL_COMMUNITY)
Admission: RE | Disposition: A | Discharge status: Home / Self Care | Payer: Self-pay | Source: Ambulatory Visit | Attending: General Surgery

## 2015-09-28 ENCOUNTER — Ambulatory Visit (HOSPITAL_COMMUNITY)
Admission: RE | Admit: 2015-09-28 | Discharge: 2015-09-28 | Disposition: A | Discharge status: Home / Self Care | Payer: 59 | Source: Ambulatory Visit | Attending: General Surgery | Admitting: General Surgery

## 2015-09-28 DIAGNOSIS — G4730 Sleep apnea, unspecified: Secondary | ICD-10-CM | POA: Diagnosis not present

## 2015-09-28 DIAGNOSIS — I10 Essential (primary) hypertension: Secondary | ICD-10-CM | POA: Insufficient documentation

## 2015-09-28 DIAGNOSIS — Z7902 Long term (current) use of antithrombotics/antiplatelets: Secondary | ICD-10-CM | POA: Diagnosis not present

## 2015-09-28 DIAGNOSIS — I2510 Atherosclerotic heart disease of native coronary artery without angina pectoris: Secondary | ICD-10-CM | POA: Insufficient documentation

## 2015-09-28 DIAGNOSIS — M1990 Unspecified osteoarthritis, unspecified site: Secondary | ICD-10-CM | POA: Insufficient documentation

## 2015-09-28 DIAGNOSIS — I252 Old myocardial infarction: Secondary | ICD-10-CM | POA: Insufficient documentation

## 2015-09-28 DIAGNOSIS — E7800 Pure hypercholesterolemia, unspecified: Secondary | ICD-10-CM | POA: Diagnosis not present

## 2015-09-28 DIAGNOSIS — K811 Chronic cholecystitis: Secondary | ICD-10-CM | POA: Diagnosis present

## 2015-09-28 DIAGNOSIS — Z7982 Long term (current) use of aspirin: Secondary | ICD-10-CM | POA: Insufficient documentation

## 2015-09-28 DIAGNOSIS — K219 Gastro-esophageal reflux disease without esophagitis: Secondary | ICD-10-CM | POA: Diagnosis not present

## 2015-09-28 DIAGNOSIS — Z79899 Other long term (current) drug therapy: Secondary | ICD-10-CM | POA: Diagnosis not present

## 2015-09-28 DIAGNOSIS — Z888 Allergy status to other drugs, medicaments and biological substances status: Secondary | ICD-10-CM | POA: Diagnosis not present

## 2015-09-28 DIAGNOSIS — Z955 Presence of coronary angioplasty implant and graft: Secondary | ICD-10-CM | POA: Insufficient documentation

## 2015-09-28 HISTORY — PX: CHOLECYSTECTOMY: SHX55

## 2015-09-28 SURGERY — LAPAROSCOPIC CHOLECYSTECTOMY WITH INTRAOPERATIVE CHOLANGIOGRAM
Anesthesia: General | Site: Abdomen

## 2015-09-28 MED ORDER — SODIUM CHLORIDE 0.9 % IJ SOLN
INTRAMUSCULAR | Status: AC
  Filled 2015-09-28: qty 10

## 2015-09-28 MED ORDER — BUPIVACAINE-EPINEPHRINE 0.5% -1:200000 IJ SOLN
INTRAMUSCULAR | Status: DC | PRN
  Administered 2015-09-28: 12 mL

## 2015-09-28 MED ORDER — FENTANYL CITRATE (PF) 100 MCG/2ML IJ SOLN
INTRAMUSCULAR | Status: DC | PRN
  Administered 2015-09-28 (×5): 50 ug via INTRAVENOUS

## 2015-09-28 MED ORDER — EPHEDRINE SULFATE 50 MG/ML IJ SOLN
INTRAMUSCULAR | Status: AC
  Filled 2015-09-28: qty 1

## 2015-09-28 MED ORDER — IOHEXOL 300 MG/ML  SOLN
INTRAMUSCULAR | Status: DC | PRN
  Administered 2015-09-28: 10 mL

## 2015-09-28 MED ORDER — BUPIVACAINE-EPINEPHRINE (PF) 0.25% -1:200000 IJ SOLN
INTRAMUSCULAR | Status: AC
  Filled 2015-09-28: qty 30

## 2015-09-28 MED ORDER — ACETAMINOPHEN 10 MG/ML IV SOLN
INTRAVENOUS | Status: DC | PRN
  Administered 2015-09-28: 1000 mg via INTRAVENOUS

## 2015-09-28 MED ORDER — ONDANSETRON HCL 4 MG/2ML IJ SOLN
INTRAMUSCULAR | Status: AC
  Filled 2015-09-28: qty 2

## 2015-09-28 MED ORDER — EPHEDRINE SULFATE 50 MG/ML IJ SOLN
INTRAMUSCULAR | Status: DC | PRN
  Administered 2015-09-28: 15 mg via INTRAVENOUS

## 2015-09-28 MED ORDER — FENTANYL CITRATE (PF) 250 MCG/5ML IJ SOLN
INTRAMUSCULAR | Status: AC
  Filled 2015-09-28: qty 5

## 2015-09-28 MED ORDER — NEOSTIGMINE METHYLSULFATE 10 MG/10ML IV SOLN
INTRAVENOUS | Status: AC
  Filled 2015-09-28: qty 1

## 2015-09-28 MED ORDER — PROPOFOL 10 MG/ML IV BOLUS
INTRAVENOUS | Status: AC
  Filled 2015-09-28: qty 20

## 2015-09-28 MED ORDER — ROCURONIUM BROMIDE 100 MG/10ML IV SOLN
INTRAVENOUS | Status: DC | PRN
  Administered 2015-09-28: 10 mg via INTRAVENOUS
  Administered 2015-09-28: 40 mg via INTRAVENOUS

## 2015-09-28 MED ORDER — PHENYLEPHRINE 40 MCG/ML (10ML) SYRINGE FOR IV PUSH (FOR BLOOD PRESSURE SUPPORT)
PREFILLED_SYRINGE | INTRAVENOUS | Status: AC
  Filled 2015-09-28: qty 10

## 2015-09-28 MED ORDER — SUGAMMADEX SODIUM 200 MG/2ML IV SOLN
INTRAVENOUS | Status: DC | PRN
  Administered 2015-09-28: 200 mg via INTRAVENOUS

## 2015-09-28 MED ORDER — MIDAZOLAM HCL 5 MG/5ML IJ SOLN
INTRAMUSCULAR | Status: DC | PRN
  Administered 2015-09-28 (×2): 1 mg via INTRAVENOUS

## 2015-09-28 MED ORDER — CEFAZOLIN SODIUM-DEXTROSE 2-3 GM-% IV SOLR
INTRAVENOUS | Status: AC
  Filled 2015-09-28: qty 50

## 2015-09-28 MED ORDER — SODIUM CHLORIDE 0.9 % IR SOLN
Status: DC | PRN
  Administered 2015-09-28: 1000 mL

## 2015-09-28 MED ORDER — FENTANYL CITRATE (PF) 100 MCG/2ML IJ SOLN
INTRAMUSCULAR | Status: AC
  Filled 2015-09-28: qty 2

## 2015-09-28 MED ORDER — CEFAZOLIN SODIUM-DEXTROSE 2-3 GM-% IV SOLR
2.0000 g | INTRAVENOUS | Status: AC
  Administered 2015-09-28: 2 g via INTRAVENOUS

## 2015-09-28 MED ORDER — ACETAMINOPHEN 10 MG/ML IV SOLN
INTRAVENOUS | Status: AC
  Filled 2015-09-28: qty 100

## 2015-09-28 MED ORDER — HYDROCODONE-ACETAMINOPHEN 5-325 MG PO TABS: 1.0000 | ORAL_TABLET | Freq: Four times a day (QID) | ORAL | Status: DC | PRN

## 2015-09-28 MED ORDER — SUCCINYLCHOLINE CHLORIDE 20 MG/ML IJ SOLN
INTRAMUSCULAR | Status: AC
  Filled 2015-09-28: qty 1

## 2015-09-28 MED ORDER — STERILE WATER FOR INJECTION IJ SOLN
INTRAMUSCULAR | Status: AC
  Filled 2015-09-28: qty 10

## 2015-09-28 MED ORDER — ONDANSETRON HCL 4 MG/2ML IJ SOLN
INTRAMUSCULAR | Status: DC | PRN
  Administered 2015-09-28: 4 mg via INTRAVENOUS

## 2015-09-28 MED ORDER — PHENYLEPHRINE HCL 10 MG/ML IJ SOLN
INTRAMUSCULAR | Status: DC | PRN
  Administered 2015-09-28 (×2): 80 ug via INTRAVENOUS

## 2015-09-28 MED ORDER — LIDOCAINE HCL (CARDIAC) 20 MG/ML IV SOLN
INTRAVENOUS | Status: DC | PRN
  Administered 2015-09-28: 80 mg via INTRAVENOUS

## 2015-09-28 MED ORDER — LACTATED RINGERS IV SOLN
INTRAVENOUS | Status: DC | PRN
  Administered 2015-09-28 (×2): via INTRAVENOUS

## 2015-09-28 MED ORDER — OXYCODONE HCL 5 MG PO TABS
ORAL_TABLET | ORAL | Status: AC
  Filled 2015-09-28: qty 2

## 2015-09-28 MED ORDER — ROCURONIUM BROMIDE 50 MG/5ML IV SOLN
INTRAVENOUS | Status: AC
  Filled 2015-09-28: qty 1

## 2015-09-28 MED ORDER — GLYCOPYRROLATE 0.2 MG/ML IJ SOLN
INTRAMUSCULAR | Status: AC
  Filled 2015-09-28: qty 3

## 2015-09-28 MED ORDER — PROPOFOL 10 MG/ML IV BOLUS
INTRAVENOUS | Status: DC | PRN
  Administered 2015-09-28: 150 mg via INTRAVENOUS
  Administered 2015-09-28: 50 mg via INTRAVENOUS

## 2015-09-28 MED ORDER — CHLORHEXIDINE GLUCONATE 4 % EX LIQD: 1.0000 "application " | Freq: Once | CUTANEOUS | Status: DC

## 2015-09-28 MED ORDER — MIDAZOLAM HCL 2 MG/2ML IJ SOLN
INTRAMUSCULAR | Status: AC
  Filled 2015-09-28: qty 4

## 2015-09-28 MED ORDER — 0.9 % SODIUM CHLORIDE (POUR BTL) OPTIME
TOPICAL | Status: DC | PRN
  Administered 2015-09-28: 1000 mL

## 2015-09-28 MED ORDER — HYDROMORPHONE HCL 1 MG/ML IJ SOLN: 0.2500 mg | INTRAMUSCULAR | Status: DC | PRN

## 2015-09-28 MED ORDER — LIDOCAINE HCL (CARDIAC) 20 MG/ML IV SOLN
INTRAVENOUS | Status: AC
  Filled 2015-09-28: qty 5

## 2015-09-28 MED ORDER — SUGAMMADEX SODIUM 200 MG/2ML IV SOLN
INTRAVENOUS | Status: AC
  Filled 2015-09-28: qty 2

## 2015-09-28 MED ORDER — OXYCODONE HCL 5 MG PO TABS
5.0000 mg | ORAL_TABLET | ORAL | Status: DC | PRN
  Administered 2015-09-28: 10 mg via ORAL
  Filled 2015-09-28: qty 2

## 2015-09-28 MED ORDER — FENTANYL CITRATE (PF) 100 MCG/2ML IJ SOLN
25.0000 ug | INTRAMUSCULAR | Status: DC | PRN
  Administered 2015-09-28 (×2): 50 ug via INTRAVENOUS
  Filled 2015-09-28 (×2): qty 1

## 2015-09-28 SURGICAL SUPPLY — 48 items
ADH SKN CLS APL DERMABOND .7 (GAUZE/BANDAGES/DRESSINGS) ×1
APPLIER CLIP ROT 10 11.4 M/L (STAPLE) ×2
APR CLP MED LRG 11.4X10 (STAPLE) ×1
BAG SPEC RTRVL LRG 6X4 10 (ENDOMECHANICALS) ×1
BLADE SURG ROTATE 9660 (MISCELLANEOUS) IMPLANT
CANISTER SUCTION 2500CC (MISCELLANEOUS) ×2 IMPLANT
CHLORAPREP W/TINT 26ML (MISCELLANEOUS) ×2 IMPLANT
CLIP APPLIE ROT 10 11.4 M/L (STAPLE) ×1 IMPLANT
COVER MAYO STAND STRL (DRAPES) ×2 IMPLANT
COVER SURGICAL LIGHT HANDLE (MISCELLANEOUS) ×2 IMPLANT
DERMABOND ADVANCED (GAUZE/BANDAGES/DRESSINGS) ×1
DERMABOND ADVANCED .7 DNX12 (GAUZE/BANDAGES/DRESSINGS) ×1 IMPLANT
DRAPE C-ARM 42X72 X-RAY (DRAPES) ×2 IMPLANT
ELECT REM PT RETURN 9FT ADLT (ELECTROSURGICAL) ×2
ELECTRODE REM PT RTRN 9FT ADLT (ELECTROSURGICAL) ×1 IMPLANT
GLOVE BIOGEL PI IND STRL 6.5 (GLOVE) IMPLANT
GLOVE BIOGEL PI IND STRL 7.0 (GLOVE) IMPLANT
GLOVE BIOGEL PI IND STRL 7.5 (GLOVE) IMPLANT
GLOVE BIOGEL PI INDICATOR 6.5 (GLOVE) ×2
GLOVE BIOGEL PI INDICATOR 7.0 (GLOVE) ×1
GLOVE BIOGEL PI INDICATOR 7.5 (GLOVE) ×1
GLOVE ECLIPSE 7.5 STRL STRAW (GLOVE) ×1 IMPLANT
GLOVE EUDERMIC 7 POWDERFREE (GLOVE) ×2 IMPLANT
GLOVE SURG SIGNA 7.5 PF LTX (GLOVE) ×1 IMPLANT
GLOVE SURG SS PI 6.5 STRL IVOR (GLOVE) ×2 IMPLANT
GOWN STRL REUS W/ TWL LRG LVL3 (GOWN DISPOSABLE) ×2 IMPLANT
GOWN STRL REUS W/ TWL XL LVL3 (GOWN DISPOSABLE) ×1 IMPLANT
GOWN STRL REUS W/TWL LRG LVL3 (GOWN DISPOSABLE) ×6
GOWN STRL REUS W/TWL XL LVL3 (GOWN DISPOSABLE) ×4
KIT BASIN OR (CUSTOM PROCEDURE TRAY) ×2 IMPLANT
KIT ROOM TURNOVER OR (KITS) ×2 IMPLANT
NS IRRIG 1000ML POUR BTL (IV SOLUTION) ×2 IMPLANT
PAD ARMBOARD 7.5X6 YLW CONV (MISCELLANEOUS) ×2 IMPLANT
POUCH SPECIMEN RETRIEVAL 10MM (ENDOMECHANICALS) ×2 IMPLANT
SCISSORS LAP 5X35 DISP (ENDOMECHANICALS) ×2 IMPLANT
SET CHOLANGIOGRAPH 5 50 .035 (SET/KITS/TRAYS/PACK) ×2 IMPLANT
SET IRRIG TUBING LAPAROSCOPIC (IRRIGATION / IRRIGATOR) ×2 IMPLANT
SLEEVE ENDOPATH XCEL 5M (ENDOMECHANICALS) ×2 IMPLANT
SPECIMEN JAR SMALL (MISCELLANEOUS) ×2 IMPLANT
SUT MNCRL AB 4-0 PS2 18 (SUTURE) ×2 IMPLANT
SUT VICRYL 0 UR6 27IN ABS (SUTURE) ×1 IMPLANT
TOWEL OR 17X24 6PK STRL BLUE (TOWEL DISPOSABLE) ×2 IMPLANT
TOWEL OR 17X26 10 PK STRL BLUE (TOWEL DISPOSABLE) ×2 IMPLANT
TRAY LAPAROSCOPIC MC (CUSTOM PROCEDURE TRAY) ×2 IMPLANT
TROCAR XCEL BLUNT TIP 100MML (ENDOMECHANICALS) ×2 IMPLANT
TROCAR XCEL NON-BLD 11X100MML (ENDOMECHANICALS) ×2 IMPLANT
TROCAR XCEL NON-BLD 5MMX100MML (ENDOMECHANICALS) ×2 IMPLANT
TUBING INSUFFLATION (TUBING) ×2 IMPLANT

## 2015-09-28 NOTE — Anesthesia Preprocedure Evaluation (Addendum)
Anesthesia Evaluation  Patient identified by MRN, date of birth, ID band Patient awake    Reviewed: Allergy & Precautions, NPO status , Patient's Chart, lab work & pertinent test results  Airway Mallampati: II  TM Distance: >3 FB Neck ROM: Full    Dental  (+) Teeth Intact, Dental Advisory Given   Pulmonary sleep apnea ,    breath sounds clear to auscultation       Cardiovascular hypertension, Pt. on medications and Pt. on home beta blockers + CAD and + Cardiac Stents   Rhythm:Regular Rate:Normal     Neuro/Psych    GI/Hepatic negative GI ROS, Neg liver ROS, GI history noted. CE   Endo/Other  negative endocrine ROS  Renal/GU negative Renal ROS     Musculoskeletal  (+) Arthritis ,   Abdominal   Peds  Hematology   Anesthesia Other Findings   Reproductive/Obstetrics                           Anesthesia Physical Anesthesia Plan  ASA: III  Anesthesia Plan: General   Post-op Pain Management:    Induction: Intravenous  Airway Management Planned: Oral ETT  Additional Equipment:   Intra-op Plan:   Post-operative Plan: Extubation in OR  Informed Consent: I have reviewed the patients History and Physical, chart, labs and discussed the procedure including the risks, benefits and alternatives for the proposed anesthesia with the patient or authorized representative who has indicated his/her understanding and acceptance.   Dental advisory given  Plan Discussed with: Anesthesiologist, Surgeon and CRNA  Anesthesia Plan Comments:        Anesthesia Quick Evaluation

## 2015-09-28 NOTE — Op Note (Signed)
Patient Name:           Brendan Cox   Date of Surgery:        09/28/2015  Pre op Diagnosis:      Chronic cholecystitis and biliary dyskinesia  Post op Diagnosis:    Same  Procedure:                 Laparoscopic cholecystectomy with cholangiogram  Surgeon:                     Angelia Mould. Derrell Lolling, M.D., FACS  Assistant:                      Abigail Miyamoto, M.D., [facility]  Operative Indications:   The patient is a 59 year old male who presents with a complaint of chronic cholecystitis and biliary dyskinesia. This is a very pleasant 59 year old Caucasian man, here with his wife. He is referred by Dr. Carman Ching for consideration of cholecystectomy. Dr. Foy Guadalajara is his PCP. Dr. Eldridge Dace is his cardiologist.      He gives a 3-4 year history of intermittent episodes of epigastric pain, belching, back pain. These are becoming more frequent and more troublesome. It is sometimes postprandial but sometimes not. He especially notices symptoms exacerbated by onions, milk products, and greasy foods. He does not have any trouble swallowing and there is no pain with swallowing. He's been on Dexilant and that did not help. His bowel movements have been pretty normal. The pain attacks start about 20 minutes after meals, when they occur after meals. Ultrasound in 2015 was normal. Ultrasound on July 09, 2049 16 is also normal. Hepatobiliary scan shows visualization of the gallbladder but the ejection fraction is reduced to 17%, normal being 40%. He's had cardiac evaluation recently and a stress test is negative. He is not having any chest pain      Past history is significant for coronary artery disease. Multiple stents placed in 2010. On Plavix and aspirin. Hypertension. Sleep apnea uses CPAP intermittently. Never had a colonoscopy.      We talked a long time about his workup to date and his symptoms. I told him that it was very likely that this was due to his gallbladder and very likely  that his symptoms would resolve, although I could not be completely sure. He wants to go ahead with gallbladder surgery.  He has been off of his aspirin and Plavix for 5 days.  He is brought to the operating room electively  Operative Findings:       The gallbladder was thin-walled but discolored and looked chronically inflamed.  The anatomy of the cystic duct, cystic artery, and common bile duct were conventional.  Intraoperative cholangiogram was normal showing normal intrahepatic and extrahepatic biliary anatomy, no filling defect, and no obstruction with good flow of contrast into the duodenum.  The cystic duct was actually extremely tiny and a little bit difficult to cannulate.  The liver, stomach, duodenum, small intestine, and large intestine were grossly normal to inspection.  No other pathology was identified.  Procedure in Detail:          Following the induction of general endotracheal anesthesia the patient's abdomen was prepped and draped in a sterile fashion.  Surgical timeout was performed.  Intravenous antibiotics were given.  0.5% Marcaine with epinephrine was used as local infiltration at aesthetic.      An 11 mm Hassan trocar was placed in the supraumbilical position  with an open technique.  Pneumoperitoneum was created.  Video camera was inserted.  11 mm trochar was  placed in subxiphoid region and two 5 mm trochars placed in the right upper quadrant.  Gallbladder was elevated.  The infundibulum was retracted.  We dissected out the cystic duct and the cystic artery and created a large window behind the structures.  Cholangiogram was performed using the C-arm and  the cholangiogram was normal as described above.  The cholangiocatheter was removed.  The cystic duct and cystic artery were controlled with multiple medical clips and divided.  Gallbladder was dissected from its bed with electrocautery and removed in a specimen bag.  We spilled a little bit of bile but there was no stone seen.   Operative field was copiously irrigated until the irrigation fluid was completely clear.  There is no evidence of bleeding or bile leak at the completion of the case.     The trochars were removed and the pneumoperitoneum was released.  There is no evidence of bleeding.  The fascia the umbilicus was closed with interrupted suture 0 Vicryl and a Purstring suture of 0 Vicryl.  Skin incisions were closed with subcuticular sutures of 4-0 Monocryl and Dermabond.  The patient tolerated the procedure well was taken to PACU in stable condition.  EBL 10 mL.  Counts correct.  Complications none.     Angelia Mould. Derrell Lolling, M.D., FACS General and Minimally Invasive Surgery Breast and Colorectal Surgery  09/28/2015 8:32 AM

## 2015-09-28 NOTE — Discharge Instructions (Signed)
[facility] ______[facility] °LAPAROSCOPIC SURGERY: POST OP INSTRUCTIONS °Always review your discharge instruction sheet given to you by the facility where your surgery was performed. °IF YOU HAVE DISABILITY OR FAMILY LEAVE FORMS, YOU MUST BRING THEM TO THE OFFICE FOR PROCESSING.   °DO NOT GIVE THEM TO YOUR DOCTOR. ° °1. A prescription for pain medication may be given to you upon discharge.  Take your pain medication as prescribed, if needed.  If narcotic pain medicine is not needed, then you may take acetaminophen (Tylenol) or ibuprofen (Advil) as needed. °2. Take your usually prescribed medications unless otherwise directed. °3. If you need a refill on your pain medication, please contact your pharmacy.  They will contact our office to request authorization. Prescriptions will not be filled after 5pm or on week-ends. °4. You should follow a light diet the first few days after arrival home, such as soup and crackers, etc.  Be sure to include lots of fluids daily. °5. Most patients will experience some swelling and bruising in the area of the incisions.  Ice packs will help.  Swelling and bruising can take several days to resolve.  °6. It is common to experience some constipation if taking pain medication after surgery.  Increasing fluid intake and taking a stool softener (such as Colace) will usually help or prevent this problem from occurring.  A mild laxative (Milk of Magnesia or Miralax) should be taken according to package instructions if there are no bowel movements after 48 hours. °7. Unless discharge instructions indicate otherwise, you may remove your bandages 24-48 hours after surgery, and you may shower at that time.  You may have steri-strips (small skin tapes) in place directly over the incision.  These strips should be left on the skin for 7-10 days.  If your surgeon used skin glue on the incision, you may shower in 24 hours.  The glue will flake off over the next 2-3 weeks.  Any sutures or  staples will be removed at the office during your follow-up visit. °8. ACTIVITIES:  You may resume regular (light) daily activities beginning the next day--such as daily self-care, walking, climbing stairs--gradually increasing activities as tolerated.  You may have sexual intercourse when it is comfortable.  Refrain from any heavy lifting or straining until approved by your doctor. °a. You may drive when you are no longer taking prescription pain medication, you can comfortably wear a seatbelt, and you can safely maneuver your car and apply brakes. °b. RETURN TO WORK:  __________________________________________________________ °9. You should see your doctor in the office for a follow-up appointment approximately 2-3 weeks after your surgery.  Make sure that you call for this appointment within a day or two after you arrive home to insure a convenient appointment time. °10. OTHER INSTRUCTIONS: __________________________________________________________________________________________________________________________ __________________________________________________________________________________________________________________________ °WHEN TO CALL YOUR DOCTOR: °1. Fever over 101.0 °2. Inability to urinate °3. Continued bleeding from incision. °4. Increased pain, redness, or drainage from the incision. °5. Increasing abdominal pain ° °The clinic staff is available to answer your questions during regular business hours.  Please don’t hesitate to call and ask to speak to one of the nurses for clinical concerns.  If you have a medical emergency, go to the nearest emergency room or call 911.  A surgeon from [facility] is always on call at the hospital. °[address], [address], [place], [place]  [address] ? [address] [address], [place], [place]   [address] °[phone] ? [phone] ? FAX [phone] °Web site:   [email or web address] °

## 2015-09-28 NOTE — Transfer of Care (Signed)
Immediate Anesthesia Transfer of Care Note  Patient: Brendan Cox  Procedure(s) Performed: Procedure(s): LAPAROSCOPIC CHOLECYSTECTOMY WITH INTRAOPERATIVE CHOLANGIOGRAM (N/A)  Patient Location: PACU  Anesthesia Type:General  Level of Consciousness: awake, alert  and oriented  Airway & Oxygen Therapy: Patient Spontanous Breathing and Patient connected to nasal cannula oxygen  Post-op Assessment: Report given to RN and Post -op Vital signs reviewed and stable  Post vital signs: Reviewed and stable  Last Vitals:  Filed Vitals:   09/28/15 0612  BP: 142/63  Pulse: 62  Temp: 36.9 C  Resp: 20    Complications: No apparent anesthesia complications

## 2015-09-28 NOTE — Anesthesia Procedure Notes (Signed)
Procedure Name: Intubation Date/Time: 09/28/2015 7:39 AM Performed by: Leonel Ramsay Pre-anesthesia Checklist: Patient identified, Patient being monitored, Emergency Drugs available, Timeout performed and Suction available Patient Re-evaluated:Patient Re-evaluated prior to inductionOxygen Delivery Method: Circle system utilized Preoxygenation: Pre-oxygenation with 100% oxygen Intubation Type: IV induction Ventilation: Mask ventilation without difficulty Laryngoscope Size: Mac and 4 Grade View: Grade I Tube type: Oral Tube size: 7.5 mm Number of attempts: 1 Airway Equipment and Method: Stylet Placement Confirmation: ETT inserted through vocal cords under direct vision,  positive ETCO2 and breath sounds checked- equal and bilateral Secured at: 23 cm Tube secured with: Tape Dental Injury: Teeth and Oropharynx as per pre-operative assessment

## 2015-09-28 NOTE — Interval H&P Note (Signed)
History and Physical Interval Note:  09/28/2015 6:56 AM  Brendan Cox  has presented today for surgery, with the diagnosis of CHRONIC CHOLECYSTITIS  The various methods of treatment have been discussed with the patient and family. After consideration of risks, benefits and other options for treatment, the patient has consented to  Procedure(s): LAPAROSCOPIC CHOLECYSTECTOMY WITH INTRAOPERATIVE CHOLANGIOGRAM (N/A) as a surgical intervention .  The patient's history has been reviewed, patient examined, no change in status, stable for surgery.  I have reviewed the patient's chart and labs.  Questions were answered to the patient's satisfaction.     Ernestene Mention

## 2015-09-29 ENCOUNTER — Encounter (HOSPITAL_COMMUNITY): Payer: Self-pay | Admitting: General Surgery

## 2015-09-29 NOTE — Anesthesia Postprocedure Evaluation (Signed)
  Anesthesia Post-op Note  Patient: Brendan Cox  Procedure(s) Performed: Procedure(s): LAPAROSCOPIC CHOLECYSTECTOMY WITH INTRAOPERATIVE CHOLANGIOGRAM (N/A)  Patient Location: PACU  Anesthesia Type:General  Level of Consciousness: awake  Airway and Oxygen Therapy: Patient Spontanous Breathing  Post-op Pain: mild  Post-op Assessment: Post-op Vital signs reviewed              Post-op Vital Signs: Reviewed  Last Vitals:  Filed Vitals:   09/28/15 1005  BP: 139/71  Pulse: 58  Temp:   Resp:     Complications: No apparent anesthesia complications

## 2016-06-08 ENCOUNTER — Other Ambulatory Visit: Payer: Self-pay | Admitting: Interventional Cardiology

## 2016-09-06 ENCOUNTER — Other Ambulatory Visit: Payer: Self-pay | Admitting: Interventional Cardiology

## 2016-11-25 ENCOUNTER — Other Ambulatory Visit: Payer: Self-pay | Admitting: Interventional Cardiology

## 2017-01-13 ENCOUNTER — Encounter (INDEPENDENT_AMBULATORY_CARE_PROVIDER_SITE_OTHER): Payer: Self-pay

## 2017-01-13 ENCOUNTER — Encounter: Payer: Self-pay | Admitting: Interventional Cardiology

## 2017-01-13 ENCOUNTER — Ambulatory Visit (INDEPENDENT_AMBULATORY_CARE_PROVIDER_SITE_OTHER): Payer: 59 | Admitting: Interventional Cardiology

## 2017-01-13 VITALS — BP 140/80 | HR 60 | Ht 68.0 in | Wt 214.8 lb

## 2017-01-13 DIAGNOSIS — E669 Obesity, unspecified: Secondary | ICD-10-CM | POA: Diagnosis not present

## 2017-01-13 DIAGNOSIS — I10 Essential (primary) hypertension: Secondary | ICD-10-CM

## 2017-01-13 DIAGNOSIS — I25119 Atherosclerotic heart disease of native coronary artery with unspecified angina pectoris: Secondary | ICD-10-CM

## 2017-01-13 DIAGNOSIS — Z6832 Body mass index (BMI) 32.0-32.9, adult: Secondary | ICD-10-CM

## 2017-01-13 DIAGNOSIS — E782 Mixed hyperlipidemia: Secondary | ICD-10-CM | POA: Diagnosis not present

## 2017-01-13 MED ORDER — CLOPIDOGREL BISULFATE 75 MG PO TABS: 75.0000 mg | ORAL_TABLET | Freq: Every day | ORAL | 3 refills | Status: DC

## 2017-01-13 NOTE — Patient Instructions (Signed)
**Note De-identified Chaunte Hornbeck Obfuscation** Medication Instructions:  Same-no changes  Labwork: None  Testing/Procedures: None  Follow-Up: Your physician wants you to follow-up in: 1 year. You will receive a reminder letter in the mail two months in advance. If you don't receive a letter, please call our office to schedule the follow-up appointment.      If you need a refill on your cardiac medications before your next appointment, please call your pharmacy.   

## 2017-01-13 NOTE — Progress Notes (Signed)
Patient ID: Brendan Cox, male   DOB: 04/14/1956, 61 y.o.   MRN: [id number]     Cardiology Office Note   Date:  01/13/2017   ID:  Brendan Cox, DOB 04/15/1956, MRN [id number]  PCP:  Lenora BoysFRIED, ROBERT L, MD    No chief complaint on file. CAD, heart burn   Wt Readings from Last 3 Encounters:  01/13/17 214 lb 12.8 oz (97.4 kg)  09/28/15 212 lb (96.2 kg)  09/21/15 212 lb 3.2 oz (96.3 kg)       History of Present Illness: Brendan Cox is a 61 y.o. male  who has had CAD, arthritis (bilateral hip replacements). He had a DES to the LAD in 7/09. He had been walking 5 x/week, for 7000-8000 steps.  He is trying to get back into shape. He walks his dog and stays active.   He had lost weight through diet control. Weight has been stable over the past year.    He is eating more regularly and healthier.  He retired a few months ago and his lifestyle has improved.  He has decreased the quantity of his junk food. He is having his hip replaced. BP at home is 128-132 systolic. CAD/ASCVD:  Denies : Chest pain. Dizziness. Leg edema. Nitroglycerin. Palpitations. Paroxysmal nocturnal dyspnea. Syncope.   He has followed with [facility] since Dr. Foy GuadalajaraFried retired.    BP is around 130/80 at home.         Past Medical History:  Diagnosis Date  . Arthritis   . Complication of anesthesia    reports that epidural did not work during left hip replacement  . Coronary artery disease   . Hearing loss in left ear   . Hyperlipidemia   . Hypertension   . Obesity   . OSA on CPAP 09/18/2014  . Spinal fracture of T12 vertebra (HCC) 10/19/2014   T12 burst fracture    Past Surgical History:  Procedure Laterality Date  . CARDIAC CATHETERIZATION    . CHOLECYSTECTOMY N/A 09/28/2015   Procedure: LAPAROSCOPIC CHOLECYSTECTOMY WITH INTRAOPERATIVE CHOLANGIOGRAM;  Surgeon: Claud KelpHaywood Ingram, MD;  Location: [facility] OR;  Service: General;  Laterality: N/A;  . CORONARY ANGIOPLASTY     x 3  . TONSILLECTOMY     as a  child  . TOTAL HIP ARTHROPLASTY     2008 on right, left 2015     Current Outpatient Prescriptions  Medication Sig Dispense Refill  . aspirin 81 MG tablet Take 81 mg by mouth daily.    Marland Kitchen. azelastine (ASTELIN) 137 MCG/SPRAY nasal spray Place 2 sprays into both nostrils 2 (two) times daily. Use in each nostril as directed    . clopidogrel (PLAVIX) 75 MG tablet TAKE 1 TABLET DAILY (PLEASE CALL [phone] TO SCHEDULE AN APPOINTMENT FOR FURTHER REFILLS  2ND ATTEMPT) 15 tablet 0  . co-enzyme Q-10 30 MG capsule Take 200 mg by mouth daily.    Marland Kitchen. Fexofenadine HCl (ALLEGRA ALLERGY PO) Take 1 tablet by mouth daily.    . hydrochlorothiazide (HYDRODIURIL) 25 MG tablet Take 25 mg by mouth daily.    Marland Kitchen. HYDROcodone-acetaminophen (NORCO/VICODIN) 5-325 MG per tablet Take 1 tablet by mouth as needed for moderate pain.    . hydrocortisone-pramoxine (ANALPRAM-HC) 2.5-1 % rectal cream Place 1 application rectally 3 (three) times daily.    . metoprolol succinate (TOPROL-XL) 50 MG 24 hr tablet Take 50 mg by mouth daily. Take with or immediately following a meal.    . mometasone (NASONEX) 50 MCG/ACT nasal  spray Place 2 sprays into the nose daily as needed (ALLERGIES).     . nitroGLYCERIN (NITROSTAT) 0.4 MG SL tablet Place 0.4 mg under the tongue every 5 (five) minutes as needed for chest pain.    Marland Kitchen tiZANidine (ZANAFLEX) 4 MG tablet Take 4 mg by mouth every 6 (six) hours as needed for muscle spasms.    Marland Kitchen zolpidem (AMBIEN) 10 MG tablet Take 10 mg by mouth at bedtime as needed for sleep.     No current facility-administered medications for this visit.     Allergies:   Diclofenac-misoprostol; Meperidine; Statins; Zetia [ezetimibe]; Diclofenac-misoprostol; and Other    Social History:  The patient  reports that he has never smoked. He has never used smokeless tobacco. He reports that he drinks about 2.4 oz of alcohol per week . He reports that he does not use drugs.   Family History:  The patient's *family history  includes Healthy in his brother and brother; Heart disease in his mother; Kidney failure in his father.    ROS:  Please see the history of present illness.   Otherwise, review of systems are positive for hip pain.   All other systems are reviewed and negative.    PHYSICAL EXAM: VS:  BP 140/80 (BP Location: Right Arm, Patient Position: Sitting, Cuff Size: Normal)   Pulse 60   Ht 5\' 8"  (1.727 m)   Wt 214 lb 12.8 oz (97.4 kg)   BMI 32.66 kg/m  , BMI Body mass index is 32.66 kg/m. GEN: Well nourished, well developed, in no acute distress  HEENT: normal  Neck: no JVD, carotid bruits, or masses Cardiac: RRR; no murmurs, rubs, or gallops,no edema ; 2+ PT pulses bilaterally Respiratory:  clear to auscultation bilaterally, normal work of breathing GI: soft, nontender, nondistended, + BS MS: no deformity or atrophy  Skin: warm and dry, no rash Neuro:  Strength and sensation are intact Psych: euthymic mood, full affect   ECG: NSR, no ST segment changes  Recent Labs: No results found for requested labs within last 8760 hours.   Lipid Panel No results found for: CHOL, TRIG, HDL, CHOLHDL, VLDL, LDLCALC, LDLDIRECT   Other studies Reviewed: Additional studies/ records that were reviewed today with results demonstrating: Normal resting ECG on 03/30/15.  Low risk stress test in 2016.   ASSESSMENT AND PLAN:  1. CAD: s/p bifurcation PCI in 2009 of the LA diagonal.  Refill plavix 75 mg daily.  2. Angina/heart burn:Occured in the past.  Last stress in 2016 was low risk.  No anterior ischemia.   Sx controlled on current meds.   3. Hyperlipidemia: LDL 162 most recently.  Intolerant of multiple statins and zetia due to myalgias. He has tried Crestor and lipitor in the past and was intolerant.  Given CAD, ? Whether he would be a candidate for a PCSK-9 inhibitor,  He was afraid of side effects associated with PCSK-9 inhibitor. He is willing to consider ORION trial. Will have research nurses contact  him.   4. HTN: Stable: COntinue current meds. 5. Obesity: Continue healthy lifestyle habits.  He has maintained his weight loss.    Current medicines are reviewed at length with the patient today.  The patient concerns regarding his medicines were addressed.  The following changes have been made:  No change  Labs/ tests ordered today include:   No orders of the defined types were placed in this encounter.   Recommend 150 minutes/week of aerobic exercise Low fat, low carb, high fiber  diet recommended  Disposition:   FU in 4 months   Signed, Lance Muss, MD  01/13/2017 9:14 AM    [facility] [address] [place], [place], [place]  [address] Phone: [phone]; Fax: [phone]

## 2017-01-16 ENCOUNTER — Other Ambulatory Visit: Payer: Self-pay | Admitting: *Deleted

## 2017-01-16 DIAGNOSIS — M545 Low back pain: Secondary | ICD-10-CM | POA: Diagnosis not present

## 2017-01-16 DIAGNOSIS — M159 Polyosteoarthritis, unspecified: Secondary | ICD-10-CM | POA: Diagnosis not present

## 2017-01-16 MED ORDER — CLOPIDOGREL BISULFATE 75 MG PO TABS: 75.0000 mg | ORAL_TABLET | Freq: Every day | ORAL | 3 refills | Status: DC

## 2017-04-18 DIAGNOSIS — M159 Polyosteoarthritis, unspecified: Secondary | ICD-10-CM | POA: Diagnosis not present

## 2017-04-18 DIAGNOSIS — K219 Gastro-esophageal reflux disease without esophagitis: Secondary | ICD-10-CM | POA: Diagnosis not present

## 2017-07-20 DIAGNOSIS — M5090 Cervical disc disorder, unspecified, unspecified cervical region: Secondary | ICD-10-CM | POA: Diagnosis not present

## 2017-07-20 DIAGNOSIS — M7540 Impingement syndrome of unspecified shoulder: Secondary | ICD-10-CM | POA: Diagnosis not present

## 2017-07-20 DIAGNOSIS — Z1211 Encounter for screening for malignant neoplasm of colon: Secondary | ICD-10-CM | POA: Diagnosis not present

## 2017-10-09 DIAGNOSIS — Z23 Encounter for immunization: Secondary | ICD-10-CM | POA: Diagnosis not present

## 2017-10-19 DIAGNOSIS — I2510 Atherosclerotic heart disease of native coronary artery without angina pectoris: Secondary | ICD-10-CM | POA: Diagnosis not present

## 2017-10-19 DIAGNOSIS — M5090 Cervical disc disorder, unspecified, unspecified cervical region: Secondary | ICD-10-CM | POA: Diagnosis not present

## 2017-10-19 DIAGNOSIS — I10 Essential (primary) hypertension: Secondary | ICD-10-CM | POA: Diagnosis not present

## 2017-11-30 DIAGNOSIS — R0981 Nasal congestion: Secondary | ICD-10-CM | POA: Diagnosis not present

## 2017-11-30 DIAGNOSIS — J018 Other acute sinusitis: Secondary | ICD-10-CM | POA: Diagnosis not present

## 2018-01-04 DIAGNOSIS — R0789 Other chest pain: Secondary | ICD-10-CM | POA: Diagnosis not present

## 2018-01-04 DIAGNOSIS — R1084 Generalized abdominal pain: Secondary | ICD-10-CM | POA: Diagnosis not present

## 2018-01-04 DIAGNOSIS — I2510 Atherosclerotic heart disease of native coronary artery without angina pectoris: Secondary | ICD-10-CM | POA: Diagnosis not present

## 2018-01-08 DIAGNOSIS — G4733 Obstructive sleep apnea (adult) (pediatric): Secondary | ICD-10-CM | POA: Diagnosis not present

## 2018-01-15 NOTE — Progress Notes (Signed)
Cardiology Office Note   Date:  01/16/2018   ID:  Brendan Cox, DOB 03/23/1956, MRN [id number]  PCP:  Lenell AntuLe, Thao P, DO    No chief complaint on file.  CAD  Wt Readings from Last 3 Encounters:  01/16/18 215 lb 12.8 oz (97.9 kg)  01/13/17 214 lb 12.8 oz (97.4 kg)  09/28/15 212 lb (96.2 kg)       History of Present Illness: Brendan Cox is a 62 y.o. male  who has had CAD, arthritis (bilateral hip replacements). He had a DES to the LAD in 7/09.  Low risk stress test in 2016, prior to cholecystectomy.   He had lost weight through diet control in the past.   He retired in 2017 and his lifestyle has improved. He had both hip replacements done.     Since the last visit, he reports fatigue.  He had been off of CPAP, but restarted this 1 week ago. He has had some nasal congestion as well.    He had some stomach pain yesterday after eating, when coming in from the yard after working.    He has not been exercising as much since the weather has been colder.    Denies : Chest pain. Dizziness. Leg edema. Nitroglycerin use. Orthopnea. Palpitations. Paroxysmal nocturnal dyspnea. Shortness of breath. Syncope.   He has been concerned about fatigue and [facility], but he thinks this is from nasal congestion.  He has increased healthy foods in his diet.  He has not had sx like what he had before his stent in 2009.  He is interested in PCSK9 inhibitor.      Past Medical History:  Diagnosis Date  . Arthritis   . Complication of anesthesia    reports that epidural did not work during left hip replacement  . Coronary artery disease   . Hearing loss in left ear   . Hyperlipidemia   . Hypertension   . Obesity   . OSA on CPAP 09/18/2014  . Spinal fracture of T12 vertebra (HCC) 10/19/2014   T12 burst fracture    Past Surgical History:  Procedure Laterality Date  . CARDIAC CATHETERIZATION    . CHOLECYSTECTOMY N/A 09/28/2015   Procedure: LAPAROSCOPIC CHOLECYSTECTOMY WITH  INTRAOPERATIVE CHOLANGIOGRAM;  Surgeon: Claud KelpHaywood Ingram, MD;  Location: [facility] OR;  Service: General;  Laterality: N/A;  . CORONARY ANGIOPLASTY     x 3  . TONSILLECTOMY     as a child  . TOTAL HIP ARTHROPLASTY     2008 on right, left 2015     Current Outpatient Medications  Medication Sig Dispense Refill  . aspirin 81 MG tablet Take 81 mg by mouth daily.    Marland Kitchen. azelastine (ASTELIN) 137 MCG/SPRAY nasal spray Place 2 sprays into both nostrils 2 (two) times daily. Use in each nostril as directed    . clopidogrel (PLAVIX) 75 MG tablet Take 1 tablet (75 mg total) by mouth daily. 90 tablet 3  . co-enzyme Q-10 30 MG capsule Take 200 mg by mouth daily.    Marland Kitchen. Fexofenadine HCl (ALLEGRA ALLERGY PO) Take 1 tablet by mouth daily.    . hydrochlorothiazide (HYDRODIURIL) 25 MG tablet Take 25 mg by mouth daily.    Marland Kitchen. HYDROcodone-acetaminophen (NORCO/VICODIN) 5-325 MG per tablet Take 1 tablet by mouth as needed for moderate pain.    . hydrocortisone-pramoxine (ANALPRAM-HC) 2.5-1 % rectal cream Place 1 application rectally 3 (three) times daily.    . metoprolol succinate (TOPROL-XL) 50 MG 24  hr tablet Take 50 mg by mouth daily. Take with or immediately following a meal.    . nitroGLYCERIN (NITROSTAT) 0.4 MG SL tablet Place 0.4 mg under the tongue every 5 (five) minutes as needed for chest pain.    Marland Kitchen tiZANidine (ZANAFLEX) 4 MG tablet Take 4 mg by mouth every 6 (six) hours as needed for muscle spasms.    Marland Kitchen zolpidem (AMBIEN) 10 MG tablet Take 10 mg by mouth at bedtime as needed for sleep.    . RABEprazole (ACIPHEX) 20 MG tablet Take 20 mg by mouth as needed.     No current facility-administered medications for this visit.     Allergies:   Diclofenac-misoprostol; Meperidine; Statins; Zetia [ezetimibe]; Diclofenac-misoprostol; and Other    Social History:  The patient  reports that  has never smoked. he has never used smokeless tobacco. He reports that he drinks about 2.4 oz of alcohol per week. He reports that he  does not use drugs.   Family History:  The patient's family history includes Healthy in his brother and brother; Heart disease in his mother; Kidney failure in his father.    ROS:  Please see the history of present illness.   Otherwise, review of systems are positive for leg pains; abdominal pain after eating.   All other systems are reviewed and negative.    PHYSICAL EXAM: VS:  BP 136/78   Pulse (!) 53   Ht 5\' 8"  (1.727 m)   Wt 215 lb 12.8 oz (97.9 kg)   SpO2 96%   BMI 32.81 kg/m  , BMI Body mass index is 32.81 kg/m. GEN: Well nourished, well developed, in no acute distress  HEENT: normal  Neck: no JVD, carotid bruits, or masses Cardiac: RRR; no murmurs, rubs, or gallops,no edema  Respiratory:  clear to auscultation bilaterally, normal work of breathing GI: soft, nontender, nondistended, + BS MS: no deformity or atrophy  Skin: warm and dry, no rash Neuro:  Strength and sensation are intact Psych: euthymic mood, full affect   EKG:   The ekg ordered today demonstrates SB, no ST changes   Recent Labs: No results found for requested labs within last 8760 hours.   Lipid Panel No results found for: CHOL, TRIG, HDL, CHOLHDL, VLDL, LDLCALC, LDLDIRECT   Other studies Reviewed: Additional studies/ records that were reviewed today with results demonstrating: 2016 stress test .   ASSESSMENT AND PLAN:  1. CAD: s/p LAD/diagonal bifurcation PCI in 2009. Low risk stress in 2016.  He has some atypical sx including abdominal pain, nasal congestion.  He is getting back to exercise and will let us know if he has any problems with exertion.  He has abdominal pain after eating certain foods and will consult with his PMD. He will let us know if he has sx. Like what he had prior to his PCI. 2. Hyperlipidemia: Intolerant of several statins.  Consider PCSK9 inhibitor or bempedoic acid trial. Will talk to the PharmD about PCSK9 inhibitor.   3. HTN: Controlled at home.  Continue current  meds. 4. Obesity: Weight is stable.  He has improved his diet and is hoping to lose weight.   Current medicines are reviewed at length with the patient today.  The patient concerns regarding his medicines were addressed.  The following changes have been made:  No change  Labs/ tests ordered today include:  No orders of the defined types were placed in this encounter.   Recommend 150 minutes/week of aerobic exercise Low fat, low  carb, high fiber diet recommended  Disposition:   FU in 3 months   Signed, Lance Muss, MD  01/16/2018 9:14 AM    [facility] [address] [place], [place], [place]  [address] Phone: [phone]; Fax: [phone]

## 2018-01-16 ENCOUNTER — Ambulatory Visit (INDEPENDENT_AMBULATORY_CARE_PROVIDER_SITE_OTHER): Payer: 59 | Admitting: Interventional Cardiology

## 2018-01-16 ENCOUNTER — Encounter (INDEPENDENT_AMBULATORY_CARE_PROVIDER_SITE_OTHER): Payer: Self-pay

## 2018-01-16 ENCOUNTER — Encounter: Payer: Self-pay | Admitting: Interventional Cardiology

## 2018-01-16 VITALS — BP 136/78 | HR 53 | Ht 68.0 in | Wt 215.8 lb

## 2018-01-16 DIAGNOSIS — Z6832 Body mass index (BMI) 32.0-32.9, adult: Secondary | ICD-10-CM

## 2018-01-16 DIAGNOSIS — E782 Mixed hyperlipidemia: Secondary | ICD-10-CM | POA: Diagnosis not present

## 2018-01-16 DIAGNOSIS — I25119 Atherosclerotic heart disease of native coronary artery with unspecified angina pectoris: Secondary | ICD-10-CM | POA: Diagnosis not present

## 2018-01-16 DIAGNOSIS — I10 Essential (primary) hypertension: Secondary | ICD-10-CM | POA: Diagnosis not present

## 2018-01-16 DIAGNOSIS — E669 Obesity, unspecified: Secondary | ICD-10-CM | POA: Diagnosis not present

## 2018-01-16 NOTE — Patient Instructions (Addendum)
Medication Instructions:  Your physician recommends that you continue on your current medications as directed. Please refer to the Current Medication list given to you today.   Labwork: None ordered  Testing/Procedures: None ordered  Follow-Up: Your physician wants you to follow-up in: 3 months with Dr. Eldridge DaceVaranasi.   Any Other Special Instructions Will Be Listed Below (If Applicable).     If you need a refill on your cardiac medications before your next appointment, please call your pharmacy.

## 2018-02-01 ENCOUNTER — Ambulatory Visit (INDEPENDENT_AMBULATORY_CARE_PROVIDER_SITE_OTHER): Payer: 59 | Admitting: Pharmacist

## 2018-02-01 DIAGNOSIS — E782 Mixed hyperlipidemia: Secondary | ICD-10-CM

## 2018-02-01 DIAGNOSIS — G8929 Other chronic pain: Secondary | ICD-10-CM | POA: Diagnosis not present

## 2018-02-01 DIAGNOSIS — M19019 Primary osteoarthritis, unspecified shoulder: Secondary | ICD-10-CM | POA: Diagnosis not present

## 2018-02-01 DIAGNOSIS — M159 Polyosteoarthritis, unspecified: Secondary | ICD-10-CM | POA: Diagnosis not present

## 2018-02-01 NOTE — Progress Notes (Signed)
Patient ID: Brendan Cox                 DOB: Apr 29, 1956                      MRN: [id number]     HPI: Brendan Cox is a pleasant 62 y.o. male referred by Dr. Eldridge Dace to lipid clinic. PMH is significant for CAD s/p DES to LAD in 2009, arthritis s/p bilateral hip replacements, OSA, and HTN. He has a history of statin intolerance and presents to lipid clinic for further management.  Pt presents today in good spirits. He has tried multiple statins throughout the years, including rosuvastatin, pravastatin, atorvastatin, and pitavastatin. He experienced severe myalgias in his legs within 3-4 week of starting each statin. His symptoms mostly resolved after drug discontinuation, however he has been left with some residual baseline leg pain. He has also tried Forensic scientist, however he experienced GI issues with each of these. He has not taken anything for his cholesterol in the past 6-8 years.  Current lipid meds: none Previously tried: rosuvastatin, pravastatin, atorvastatin, Livalo, Welchol, Zetia - stomach issues and severe muscle pain in his thighs  Risk factors: CAD s/p DES to LAD in 2009, family history  LDL goal: 70mg /dL  Family History: The patient's family history includes Healthy in his brother and brother; Heart disease in his mother; Kidney failure in his father. Mother died from MI at age 22.  Social History: The patient  reports that  has never smoked. he has never used smokeless tobacco. He reports that he drinks about 2.4 oz of alcohol per week. He reports that he does not use drugs.   Diet: Cut back on red meat, eating more fruits and vegetables, grilled or baked meat. Cooks most meals at home since he retired last year.  Exercise: Plans to start going to [facility] [employer] 3-4 days per week. Uses a recumbent bicycle as well.  Labs: 10/19/17: TC 239, HDL 45, TG 105, LDL 173, non-HDL 194 (no therapy) 04/21/15: TC 232, TG 128, HDL 44, LDL 162, non-HDL 188 (no therapy)  Wt Readings  from Last 3 Encounters:  01/16/18 215 lb 12.8 oz (97.9 kg)  01/13/17 214 lb 12.8 oz (97.4 kg)  09/28/15 212 lb (96.2 kg)   BP Readings from Last 3 Encounters:  01/16/18 136/78  01/13/17 140/80  09/28/15 139/71   Pulse Readings from Last 3 Encounters:  01/16/18 (!) 53  01/13/17 60  09/28/15 (!) 58    Renal function: CrCl cannot be calculated (Patient's most recent lab result is older than the maximum 21 days allowed.).  Past Medical History:  Diagnosis Date  . Arthritis   . Complication of anesthesia    reports that epidural did not work during left hip replacement  . Coronary artery disease   . Hearing loss in left ear   . Hyperlipidemia   . Hypertension   . Obesity   . OSA on CPAP 09/18/2014  . Spinal fracture of T12 vertebra (HCC) 10/19/2014   T12 burst fracture    Current Outpatient Medications on File Prior to Visit  Medication Sig Dispense Refill  . aspirin 81 MG tablet Take 81 mg by mouth daily.    Marland Kitchen azelastine (ASTELIN) 137 MCG/SPRAY nasal spray Place 2 sprays into both nostrils 2 (two) times daily. Use in each nostril as directed    . clopidogrel (PLAVIX) 75 MG tablet Take 1 tablet (75 mg total) by mouth daily.  90 tablet 3  . co-enzyme Q-10 30 MG capsule Take 200 mg by mouth daily.    Marland Kitchen. Fexofenadine HCl (ALLEGRA ALLERGY PO) Take 1 tablet by mouth daily.    . hydrochlorothiazide (HYDRODIURIL) 25 MG tablet Take 25 mg by mouth daily.    Marland Kitchen. HYDROcodone-acetaminophen (NORCO/VICODIN) 5-325 MG per tablet Take 1 tablet by mouth as needed for moderate pain.    . hydrocortisone-pramoxine (ANALPRAM-HC) 2.5-1 % rectal cream Place 1 application rectally 3 (three) times daily.    . metoprolol succinate (TOPROL-XL) 50 MG 24 hr tablet Take 50 mg by mouth daily. Take with or immediately following a meal.    . nitroGLYCERIN (NITROSTAT) 0.4 MG SL tablet Place 0.4 mg under the tongue every 5 (five) minutes as needed for chest pain.    . RABEprazole (ACIPHEX) 20 MG tablet Take 20 mg by  mouth as needed.    Marland Kitchen. tiZANidine (ZANAFLEX) 4 MG tablet Take 4 mg by mouth every 6 (six) hours as needed for muscle spasms.    Marland Kitchen. zolpidem (AMBIEN) 10 MG tablet Take 10 mg by mouth at bedtime as needed for sleep.     No current facility-administered medications on file prior to visit.     Allergies  Allergen Reactions  . Diclofenac-Misoprostol Nausea And Vomiting and Nausea Only  . Meperidine Nausea And Vomiting    Other reaction(s): Vomiting (intolerance)  . Statins Other (See Comments)    Other reaction(s): Other (See Comments) Patient stated that statin drugs gives him muscle pain and joint pain  Patient stated that statin drugs gives him muscle pain and joint pain  . Zetia [Ezetimibe] Other (See Comments)    Muscle cramps   . Diclofenac-Misoprostol Nausea Only  . Other Nausea Only    ARTHROTEC 50     Assessment/Plan:  1. Hypertension - Baseline LDL 173 far above goal <70 due to hx of ASCVD. Pt is intolerant to 4 statins, Zetia, and Welchol (severe leg myalgias on statin therapy and GI issues with Zetia and Welchol). PCSK9i is the only option to bring LDL to goal. Discussed expected benefits, side effects, and injection technique. Will submit prior authorization for Praluent 150mg /mL Q2W. Pt prefers to do his first injection in clinic. Will contact him on his cell per pt preference once insurance decision is made regarding Praluent coverage.   Ayona Yniguez E. Mychal Decarlo, PharmD, CPP, BCACP [facility] [address], [place], [place] [address] Phone: [phone]; Fax: [phone] 02/01/2018 9:12 AM

## 2018-02-05 ENCOUNTER — Telehealth: Payer: Self-pay | Admitting: Pharmacist

## 2018-02-05 MED ORDER — ALIROCUMAB 150 MG/ML ~~LOC~~ SOPN: 1.0000 "pen " | PEN_INJECTOR | SUBCUTANEOUS | 11 refills | Status: DC

## 2018-02-05 NOTE — Telephone Encounter (Signed)
Received approval for Praluent through 02/04/19. Rx sent to specialty pharmacy and pt is aware. He will call clinic once he receives his first shipment so that he can give his first injection in clinic.

## 2018-02-06 MED ORDER — ALIROCUMAB 150 MG/ML ~~LOC~~ SOPN: 1.0000 "pen " | PEN_INJECTOR | SUBCUTANEOUS | 11 refills | Status: DC

## 2018-02-06 NOTE — Addendum Note (Signed)
Addended by: SUPPLE, MEGAN E on: 02/06/2018 02:30 PM   Modules accepted: Orders

## 2018-02-06 NOTE — Telephone Encounter (Signed)
Praluent needs to be filled at [facility] specialty pharmacy per patient's insurance. New rx sent in and pt is aware.

## 2018-02-13 ENCOUNTER — Telehealth: Payer: Self-pay | Admitting: Cardiovascular Disease

## 2018-02-13 NOTE — Telephone Encounter (Signed)
Did not need this encounter °

## 2018-02-14 DIAGNOSIS — Z1212 Encounter for screening for malignant neoplasm of rectum: Secondary | ICD-10-CM | POA: Diagnosis not present

## 2018-02-14 DIAGNOSIS — Z1211 Encounter for screening for malignant neoplasm of colon: Secondary | ICD-10-CM | POA: Diagnosis not present

## 2018-02-16 ENCOUNTER — Telehealth: Payer: Self-pay | Admitting: Pharmacist

## 2018-02-16 NOTE — Telephone Encounter (Signed)
Patient presented to clinic for Praluent injection training. Pt successfully self administered Praluent into the abdomen. He will have lipids checked at PCP office after 3 injections.

## 2018-03-26 ENCOUNTER — Encounter: Payer: Self-pay | Admitting: Interventional Cardiology

## 2018-03-26 DIAGNOSIS — E782 Mixed hyperlipidemia: Secondary | ICD-10-CM | POA: Diagnosis not present

## 2018-03-26 DIAGNOSIS — R0781 Pleurodynia: Secondary | ICD-10-CM | POA: Diagnosis not present

## 2018-03-29 NOTE — Telephone Encounter (Signed)
Lipid results faxed from PCP office.   03/26/18 TC 156, HDL 49, TG 100, LDL 87, nonHDL 107  After 3 doses of Praluent 150mg  Q14D. Pt with a 50% reduction (173->87) in LDL and LDL now much closer to target <70. Will continue current management.   Labs sent to medical records for scanning.

## 2018-03-30 ENCOUNTER — Other Ambulatory Visit: Payer: Self-pay | Admitting: Interventional Cardiology

## 2018-04-15 NOTE — Progress Notes (Signed)
Cardiology Office Note   Date:  04/16/2018   ID:  Brendan Cox, DOB 16-Feb-1956, MRN [id number]  PCP:  Lenell Antu, DO    No chief complaint on file.  CAD  Wt Readings from Last 3 Encounters:  04/16/18 216 lb (98 kg)  01/16/18 215 lb 12.8 oz (97.9 kg)  01/13/17 214 lb 12.8 oz (97.4 kg)       History of Present Illness: Brendan Cox is a 62 y.o. male  who has had CAD, arthritis (bilateral hip replacements). He had a DES to the LAD in 7/09.  Low risk stress test in 2016, prior to cholecystectomy.   He had lost weight through diet control in the past.   He retired in 2017 and his lifestyle has improved. He had both hip replacements done.     In Jan 2019 at the last visit, he reports fatigue.  He had been off of CPAP, but restarted this 1 week be. Fore that appointment. He has had some nasal congestion as well.    He had some stomach pain at that time after eating, when coming in from the yard after working.    1. Plan at that time was : CAD: s/p LAD/diagonal bifurcation PCI in 2009. Low risk stress in 2016.  He has some atypical sx including abdominal pain, nasal congestion.  He is getting back to exercise and will let us know if he has any problems with exertion.  He has abdominal pain after eating certain foods and will consult with his PMD. He will let us know if he has sx. Like what he had prior to his PCI.  Since the last visit:  He has been exercising at [employer] regularly.  He has not had anginal sx.  No GERD sx associated with exertion.  He rides the stationary bike.  He gets his heart rate up to about 110 without difficulty.  Denies : Chest pain. Dizziness. Leg edema. Nitroglycerin use. Orthopnea. Palpitations. Paroxysmal nocturnal dyspnea. Shortness of breath. Syncope.      Past Medical History:  Diagnosis Date  . Arthritis   . Complication of anesthesia    reports that epidural did not work during left hip replacement  . Coronary artery disease     . Hearing loss in left ear   . Hyperlipidemia   . Hypertension   . Obesity   . OSA on CPAP 09/18/2014  . Spinal fracture of T12 vertebra (HCC) 10/19/2014   T12 burst fracture    Past Surgical History:  Procedure Laterality Date  . CARDIAC CATHETERIZATION    . CHOLECYSTECTOMY N/A 09/28/2015   Procedure: LAPAROSCOPIC CHOLECYSTECTOMY WITH INTRAOPERATIVE CHOLANGIOGRAM;  Surgeon: Claud Kelp, MD;  Location: [facility] OR;  Service: General;  Laterality: N/A;  . CORONARY ANGIOPLASTY     x 3  . TONSILLECTOMY     as a child  . TOTAL HIP ARTHROPLASTY     2008 on right, left 2015     Current Outpatient Medications  Medication Sig Dispense Refill  . Alirocumab (PRALUENT) 150 MG/ML SOPN Inject 1 pen into the skin every 14 (fourteen) days. 2 pen 11  . amoxicillin (AMOXIL) 500 MG capsule TAKE 4 CAPSULES 1 HOUR PRIOR TO DENTAL PROCEDURE  2  . aspirin 81 MG tablet Take 81 mg by mouth daily.    Marland Kitchen azelastine (ASTELIN) 137 MCG/SPRAY nasal spray Place 2 sprays into both nostrils 2 (two) times daily. Use in each nostril as directed    .  clopidogrel (PLAVIX) 75 MG tablet TAKE 1 TABLET BY MOUTH DAILY 90 tablet 2  . co-enzyme Q-10 30 MG capsule Take 200 mg by mouth daily.    Marland Kitchen Fexofenadine HCl (ALLEGRA ALLERGY PO) Take 1 tablet by mouth daily.    . hydrochlorothiazide (HYDRODIURIL) 25 MG tablet Take 25 mg by mouth daily.    Marland Kitchen HYDROcodone-acetaminophen (NORCO/VICODIN) 5-325 MG per tablet Take 1 tablet by mouth as needed for moderate pain.    . hydrocortisone-pramoxine (ANALPRAM-HC) 2.5-1 % rectal cream Place 1 application rectally 3 (three) times daily as needed for hemorrhoids.     . metoprolol succinate (TOPROL-XL) 50 MG 24 hr tablet Take 50 mg by mouth daily. Take with or immediately following a meal.    . nitroGLYCERIN (NITROSTAT) 0.4 MG SL tablet Place 0.4 mg under the tongue every 5 (five) minutes as needed for chest pain.    . RABEprazole (ACIPHEX) 20 MG tablet Take 20 mg by mouth as needed.    Marland Kitchen  tiZANidine (ZANAFLEX) 4 MG tablet Take 4 mg by mouth every 6 (six) hours as needed for muscle spasms.    Marland Kitchen zolpidem (AMBIEN) 10 MG tablet Take 10 mg by mouth at bedtime as needed for sleep.     No current facility-administered medications for this visit.     Allergies:   Diclofenac-misoprostol; Meperidine; Statins; Zetia [ezetimibe]; Diclofenac-misoprostol; and Other    Social History:  The patient  reports that he has never smoked. He has never used smokeless tobacco. He reports that he drinks about 2.4 oz of alcohol per week. He reports that he does not use drugs.   Family History:  The patient's family history includes Healthy in his brother and brother; Heart disease in his mother; Kidney failure in his father.    ROS:  Please see the history of present illness.   Otherwise, review of systems are positive for occasional joint pains.   All other systems are reviewed and negative.    PHYSICAL EXAM: VS:  BP 118/74   Pulse (!) 59   Ht  (1.727 m)   Wt 216 lb (98 kg)   SpO2 95%   BMI 32.84 kg/m  , BMI Body mass index is 32.84 kg/m. GEN: Well nourished, well developed, in no acute distress  HEENT: normal  Neck: no JVD, carotid bruits, or masses Cardiac: RRR; no murmurs, rubs, or gallops,no edema  Respiratory:  clear to auscultation bilaterally, normal work of breathing GI: soft, nontender, nondistended, + BS, obese MS: no deformity or atrophy  Skin: warm and dry, no rash Neuro:  Strength and sensation are intact Psych: euthymic mood, full affect    Recent Labs: No results found for requested labs within last 8760 hours.   Lipid Panel No results found for: CHOL, TRIG, HDL, CHOLHDL, VLDL, LDLCALC, LDLDIRECT   Other studies Reviewed: Additional studies/ records that were reviewed today with results demonstrating: LDL 173 in 11/18.  LDL 87 in 4/19 with TG 100, HDL 49,  TC 156 on PCSK9 inhibitor.   ASSESSMENT AND PLAN:  1. CAD: No angina on medical therapy.  Abdominal  pain is not exertional.  He is exercising without difficulty.  He did have some atypical chest pain after falling while playing wiffle ball.  2. HTN: BP controlled.  Continue current medications. 3. Hyperlipidemia: LDL improved significantly.  All questions answered he was concerned that a sudden drop in LDL could cause a problem.Marland Kitchen He was concerned that his LDL may be too low and wanted  to decrease the dose of his PCSK9 inhibitor.  I recommended that he continue current dose.   4. Obesity: Stable over the last few months.  Continue healthy diet and regular exercise to try and lose weight.   Current medicines are reviewed at length with the patient today.  The patient concerns regarding his medicines were addressed.  The following changes have been made:  No change  Labs/ tests ordered today include:  No orders of the defined types were placed in this encounter.   Recommend 150 minutes/week of aerobic exercise Low fat, low carb, high fiber diet recommended  Disposition:   FU in 1 year   Signed, Lance Muss, MD  04/16/2018 8:15 AM    [facility] [address] [place], [place], [place]  [address] Phone: [phone]; Fax: [phone]

## 2018-04-16 ENCOUNTER — Ambulatory Visit (INDEPENDENT_AMBULATORY_CARE_PROVIDER_SITE_OTHER): Payer: 59 | Admitting: Interventional Cardiology

## 2018-04-16 ENCOUNTER — Encounter: Payer: Self-pay | Admitting: Interventional Cardiology

## 2018-04-16 VITALS — BP 118/74 | HR 59 | Ht 68.0 in | Wt 216.0 lb

## 2018-04-16 DIAGNOSIS — Z6832 Body mass index (BMI) 32.0-32.9, adult: Secondary | ICD-10-CM | POA: Diagnosis not present

## 2018-04-16 DIAGNOSIS — I25119 Atherosclerotic heart disease of native coronary artery with unspecified angina pectoris: Secondary | ICD-10-CM | POA: Diagnosis not present

## 2018-04-16 DIAGNOSIS — E669 Obesity, unspecified: Secondary | ICD-10-CM | POA: Diagnosis not present

## 2018-04-16 DIAGNOSIS — E782 Mixed hyperlipidemia: Secondary | ICD-10-CM

## 2018-04-16 DIAGNOSIS — I10 Essential (primary) hypertension: Secondary | ICD-10-CM | POA: Diagnosis not present

## 2018-04-16 NOTE — Patient Instructions (Signed)

## 2018-04-19 ENCOUNTER — Telehealth: Payer: Self-pay | Admitting: Interventional Cardiology

## 2018-04-19 NOTE — Telephone Encounter (Signed)
New message   Raynelle Fanning from [employer] (spec pharm) [phone] ext [phone],  calling to verify if patient is taking any other lipid lowering medication in addition to Alirocumab (PRALUENT) 150 MG/ML SOPN. Also requesting LDL  Level information.

## 2018-04-19 NOTE — Telephone Encounter (Signed)
Returned call to pharmacy who needs information for insurance audit. Information provided as requested.

## 2018-04-23 DIAGNOSIS — R269 Unspecified abnormalities of gait and mobility: Secondary | ICD-10-CM | POA: Diagnosis not present

## 2018-04-23 DIAGNOSIS — M5441 Lumbago with sciatica, right side: Secondary | ICD-10-CM | POA: Diagnosis not present

## 2018-04-25 DIAGNOSIS — M5441 Lumbago with sciatica, right side: Secondary | ICD-10-CM | POA: Diagnosis not present

## 2018-04-25 DIAGNOSIS — M25551 Pain in right hip: Secondary | ICD-10-CM | POA: Diagnosis not present

## 2018-05-02 DIAGNOSIS — M159 Polyosteoarthritis, unspecified: Secondary | ICD-10-CM | POA: Diagnosis not present

## 2018-05-02 DIAGNOSIS — M545 Low back pain: Secondary | ICD-10-CM | POA: Diagnosis not present

## 2018-05-02 DIAGNOSIS — I10 Essential (primary) hypertension: Secondary | ICD-10-CM | POA: Diagnosis not present

## 2018-05-08 DIAGNOSIS — M5416 Radiculopathy, lumbar region: Secondary | ICD-10-CM | POA: Diagnosis not present

## 2018-05-15 DIAGNOSIS — M545 Low back pain: Secondary | ICD-10-CM | POA: Diagnosis not present

## 2018-05-18 DIAGNOSIS — M545 Low back pain: Secondary | ICD-10-CM | POA: Diagnosis not present

## 2018-05-18 DIAGNOSIS — M5416 Radiculopathy, lumbar region: Secondary | ICD-10-CM | POA: Diagnosis not present

## 2018-05-31 DIAGNOSIS — M5416 Radiculopathy, lumbar region: Secondary | ICD-10-CM | POA: Diagnosis not present

## 2018-06-11 ENCOUNTER — Telehealth: Payer: Self-pay | Admitting: Pharmacist

## 2018-06-11 NOTE — Telephone Encounter (Signed)
Pt called and reported that he has pain in leg pain in his thighs that range from 5-9 on scale of 10 and is very weak. He states that the only thing that has changed is starting the Praluent. He would like to discontinue the medication. He took his last dose on Friday and pain does increase around each time he gives a new injection. Will plan to stop medication and call in 3-4 weeks to see if pain has improved. Will plan to discuss options at that time if unable/unwilling to go back on Praluent. Pt is in agreement with plan.   Will route to Dr. Eldridge DaceVaranasi as Lorain ChildesFYI.

## 2018-06-13 DIAGNOSIS — M545 Low back pain: Secondary | ICD-10-CM | POA: Diagnosis not present

## 2018-07-11 MED ORDER — ALIROCUMAB 75 MG/ML ~~LOC~~ SOPN: 1.0000 "pen " | PEN_INJECTOR | SUBCUTANEOUS | 11 refills | Status: DC

## 2018-07-11 NOTE — Telephone Encounter (Signed)
Spoke with pt to follow up with leg pains since stopping Praluent a month ago. Pt states his leg pain and weakness started June 10th. He reports aches improved gradually ~3 weeks after Praluent discontinuation.  Will try resuming Praluent at lower dose of 75mg  every 2 weeks. Pt will call if he develops any myalgias or leg weakness. If he tolerates lower dose well, will plan to recheck lipids in 2-3 months.

## 2018-07-11 NOTE — Addendum Note (Signed)
Addended by: Kysean Sweet E on: 07/11/2018 02:40 PM   Modules accepted: Orders

## 2018-08-01 DIAGNOSIS — L290 Pruritus ani: Secondary | ICD-10-CM | POA: Diagnosis not present

## 2018-08-01 DIAGNOSIS — M545 Low back pain: Secondary | ICD-10-CM | POA: Diagnosis not present

## 2018-08-01 DIAGNOSIS — G894 Chronic pain syndrome: Secondary | ICD-10-CM | POA: Diagnosis not present

## 2018-08-07 DIAGNOSIS — H2513 Age-related nuclear cataract, bilateral: Secondary | ICD-10-CM | POA: Diagnosis not present

## 2018-08-07 DIAGNOSIS — H40013 Open angle with borderline findings, low risk, bilateral: Secondary | ICD-10-CM | POA: Diagnosis not present

## 2018-08-08 DIAGNOSIS — H903 Sensorineural hearing loss, bilateral: Secondary | ICD-10-CM | POA: Diagnosis not present

## 2018-08-09 DIAGNOSIS — J029 Acute pharyngitis, unspecified: Secondary | ICD-10-CM | POA: Diagnosis not present

## 2018-08-09 DIAGNOSIS — R591 Generalized enlarged lymph nodes: Secondary | ICD-10-CM | POA: Diagnosis not present

## 2018-08-09 DIAGNOSIS — M542 Cervicalgia: Secondary | ICD-10-CM | POA: Diagnosis not present

## 2018-09-13 DIAGNOSIS — Z23 Encounter for immunization: Secondary | ICD-10-CM | POA: Diagnosis not present

## 2018-10-25 DIAGNOSIS — M1990 Unspecified osteoarthritis, unspecified site: Secondary | ICD-10-CM | POA: Diagnosis not present

## 2018-10-25 DIAGNOSIS — E782 Mixed hyperlipidemia: Secondary | ICD-10-CM | POA: Diagnosis not present

## 2018-10-25 DIAGNOSIS — M545 Low back pain: Secondary | ICD-10-CM | POA: Diagnosis not present

## 2018-11-05 DIAGNOSIS — E782 Mixed hyperlipidemia: Secondary | ICD-10-CM | POA: Diagnosis not present

## 2018-11-07 ENCOUNTER — Telehealth: Payer: Self-pay | Admitting: Pharmacist

## 2018-11-07 NOTE — Telephone Encounter (Signed)
Pt LMOM stating that he is still experiencing leg weakness on lower dose of praluent 75mg . He states it is less intense, but still interferes with his daily activities.   He does endorse that this cholesterol is worse on lower dose than previous.   Returned call to patient and had to leave message.

## 2018-11-08 NOTE — Telephone Encounter (Addendum)
LDL previously better at 87 on higher dose of Praluent 150 Q2W, however he experienced myalgias so we reduced his dose. LDL worse on lower dose of Praluent and pt is still experiencing weakness and pain in his legs - will stop therapy. He is previously intolerant to rosuvastatin, pravastatin, atorvastatin, Livalo, Welchol, and Zetia (severe muscle pain in thighs and stomach issues). He tried previous statins at low doses and 2-3 days a week but did not tolerate these either. He does not qualify for any clinical trials that we are currently enrolling in unfortunately. Discussed trying bempedoic acid once it becomes FDA approved (anticipated February 2020) which pt is in agreement with.

## 2018-11-29 DIAGNOSIS — M79605 Pain in left leg: Secondary | ICD-10-CM | POA: Diagnosis not present

## 2018-11-29 DIAGNOSIS — M79604 Pain in right leg: Secondary | ICD-10-CM | POA: Diagnosis not present

## 2018-11-29 DIAGNOSIS — J069 Acute upper respiratory infection, unspecified: Secondary | ICD-10-CM | POA: Diagnosis not present

## 2018-12-07 DIAGNOSIS — J069 Acute upper respiratory infection, unspecified: Secondary | ICD-10-CM | POA: Diagnosis not present

## 2019-03-28 ENCOUNTER — Telehealth: Payer: Self-pay | Admitting: Pharmacist

## 2019-03-28 ENCOUNTER — Other Ambulatory Visit: Payer: Self-pay | Admitting: Pharmacist

## 2019-03-28 NOTE — Telephone Encounter (Signed)
Spoke with pt regarding Nexletol for cholesterol lowering since he has been intolerant to multiple statins and Praluent. He would like to research the medication as he reports excruciating leg pain on Praluent which was supposed to be tolerated well, too. Provided pt with information regarding Nexletol benefits and he will call clinic if he wishes to start on therapy.

## 2019-04-18 ENCOUNTER — Telehealth: Payer: Self-pay

## 2019-04-18 NOTE — Telephone Encounter (Signed)
   Spoke to patient who states that for the past 3 months he has had worsening SOB, dizziness, and tightness in his chest. Patient states that he is able to cut the grass with some mild SOB, but denies having any chest pain like he did before. Patient requesting an in office visit with EKG. Discussed with Dr. Eldridge Dace and patient will keep his appointment on 5/5 for an in office visit where we will get an EKG and draw labs. Patient will let us know if his Sx change or worsen.  Cardiac Questionnaire:    Since your last visit or hospitalization:    1. Have you been having new or worsening chest pain? Yes   2. Have you been having new or worsening shortness of breath? Yes 3. Have you been having new or worsening leg swelling, wt gain, or increase in abdominal girth (pants fitting more tightly)? No   4. Have you had any passing out spells? No     _____________   COVID-19 Pre-Screening Questions:  . Do you currently have a fever? No  . Have you recently travelled on a cruise, internationally, or to [place], [place], [place], [place], [place], or [place], [place] [employer]) ? No  . Have you been in contact with someone that is currently pending confirmation of Covid19 testing or has been confirmed to have the Covid19 virus?  No Are you currently experiencing fatigue or cough? No

## 2019-04-22 NOTE — Progress Notes (Signed)
Cardiology Office Note   Date:  04/23/2019   ID:  Brendan Cox, DOB 05/03/1956, MRN [id number]  PCP:  Koren Shiver, DO    No chief complaint on file.  CAD  Wt Readings from Last 3 Encounters:  04/23/19 218 lb 6.4 oz (99.1 kg)  04/16/18 216 lb (98 kg)  01/16/18 215 lb 12.8 oz (97.9 kg)       History of Present Illness: Brendan Cox is a 63 y.o. male who has had CAD, arthritis (bilateral hip replacements). He had a DES to the LAD in 7/09.Low risk stress test in 2016, prior to cholecystectomy.  He had lost weight through diet controlin the past.   He retiredin 2017and his lifestyle has improved. He hadbothhip replacements done.   In Jan 2019 at the last visit, he reports fatigue. He had been off of CPAP, but restarted this 1 week be. Fore that appointment.   He took a PCSK9 inhibitor (Praluent) and felt muscle pain and weakness.    He has had shortness of breath.  He has been having some burning in his chest and [facility] while working in the yard.  Not as severe as what he had prior to his stents.  He continues to work, and has not had to stop.  Sx will eventually resolve.    Past Medical History:  Diagnosis Date  . Arthritis   . Complication of anesthesia    reports that epidural did not work during left hip replacement  . Coronary artery disease   . Hearing loss in left ear   . Hyperlipidemia   . Hypertension   . Obesity   . OSA on CPAP 09/18/2014  . Spinal fracture of T12 vertebra (HCC) 10/19/2014   T12 burst fracture    Past Surgical History:  Procedure Laterality Date  . CARDIAC CATHETERIZATION    . CHOLECYSTECTOMY N/A 09/28/2015   Procedure: LAPAROSCOPIC CHOLECYSTECTOMY WITH INTRAOPERATIVE CHOLANGIOGRAM;  Surgeon: Claud Kelp, MD;  Location: [facility] OR;  Service: General;  Laterality: N/A;  . CORONARY ANGIOPLASTY     x 3  . TONSILLECTOMY     as a child  . TOTAL HIP ARTHROPLASTY     2008 on right, left 2015     Current Outpatient  Medications  Medication Sig Dispense Refill  . amoxicillin (AMOXIL) 500 MG capsule TAKE 4 CAPSULES 1 HOUR PRIOR TO DENTAL PROCEDURE  2  . aspirin 81 MG tablet Take 81 mg by mouth daily.    Marland Kitchen azelastine (ASTELIN) 137 MCG/SPRAY nasal spray Place 2 sprays into both nostrils 2 (two) times daily. Use in each nostril as directed    . clopidogrel (PLAVIX) 75 MG tablet TAKE 1 TABLET BY MOUTH DAILY 90 tablet 2  . co-enzyme Q-10 30 MG capsule Take 200 mg by mouth daily.    Marland Kitchen Fexofenadine HCl (ALLEGRA ALLERGY PO) Take 1 tablet by mouth daily.    . hydrochlorothiazide (HYDRODIURIL) 25 MG tablet Take 25 mg by mouth daily.    Marland Kitchen HYDROcodone-acetaminophen (NORCO/VICODIN) 5-325 MG per tablet Take 1 tablet by mouth as needed for moderate pain.    . hydrocortisone-pramoxine (ANALPRAM-HC) 2.5-1 % rectal cream Place 1 application rectally 3 (three) times daily as needed for hemorrhoids.     . metoprolol succinate (TOPROL-XL) 50 MG 24 hr tablet Take 50 mg by mouth daily. Take with or immediately following a meal.    . nitroGLYCERIN (NITROSTAT) 0.4 MG SL tablet Place 0.4 mg under the tongue every 5 (five)  minutes as needed for chest pain.    . RABEprazole (ACIPHEX) 20 MG tablet Take 20 mg by mouth as needed.    Marland Kitchen. tiZANidine (ZANAFLEX) 4 MG tablet Take 4 mg by mouth every 6 (six) hours as needed for muscle spasms.    Marland Kitchen. zolpidem (AMBIEN) 10 MG tablet Take 10 mg by mouth at bedtime as needed for sleep.     No current facility-administered medications for this visit.     Allergies:   Diclofenac-misoprostol; Meperidine; Statins; Zetia [ezetimibe]; Diclofenac-misoprostol; Other; and Praluent [alirocumab]    Social History:  The patient  reports that he has never smoked. He has never used smokeless tobacco. He reports current alcohol use of about 4.0 standard drinks of alcohol per week. He reports that he does not use drugs.   Family History:  The patient's family history includes Healthy in his brother and brother;  Heart disease in his mother; Kidney failure in his father.    ROS:  Please see the history of present illness.   Otherwise, review of systems are positive for DOE, chest burning.   All other systems are reviewed and negative.    PHYSICAL EXAM: VS:  BP (!) 144/88   Pulse 63   Ht 5\' 8"  (1.727 m)   Wt 218 lb 6.4 oz (99.1 kg)   SpO2 97%   BMI 33.21 kg/m  , BMI Body mass index is 33.21 kg/m. GEN: Well nourished, well developed, in no acute distress  HEENT: normal  Neck: no JVD, carotid bruits, or masses Cardiac: RRR; no murmurs, rubs, or gallops,no edema  Respiratory:  clear to auscultation bilaterally, normal work of breathing GI: soft, nontender, nondistended, + BS MS: no deformity or atrophy  Skin: warm and dry, no rash Neuro:  Strength and sensation are intact Psych: euthymic mood, full affect   EKG:   The ekg ordered today demonstrates NSR, no ST changes   Recent Labs: No results found for requested labs within last 8760 hours.   Lipid Panel No results found for: CHOL, TRIG, HDL, CHOLHDL, VLDL, LDLCALC, LDLDIRECT   Other studies Reviewed: Additional studies/ records that were reviewed today with results demonstrating: prior stress test result reviewed.   ASSESSMENT AND PLAN:  1. CAD: Possible anginal sx.  Plan for stress test May need chemical test since he has trouble with the treadmill due to leg pain. We discussed cath as well. WIll check BNP given shortness of breath. 2. HTN: The current medical regimen is effective;  continue present plan and medications. 3. Hyperlipidemia: Intolerant of PCSK9 inhibitor.  ? Whether he would be candidate for bempedoic acid. 4. Obesity: Chronic issue.  COntinue healthy diet.  Exercise has been limited.    Current medicines are reviewed at length with the patient today.  The patient concerns regarding his medicines were addressed.  The following changes have been made:  No change  Labs/ tests ordered today include:  No orders  of the defined types were placed in this encounter.   Recommend 150 minutes/week of aerobic exercise Low fat, low carb, high fiber diet recommended  Disposition:   FU in 1 year   Signed, Lance MussJayadeep Buren Havey, MD  04/23/2019 9:50 AM    [facility] [address], [place], [place]  [address] Phone: [phone]; Fax: [phone]

## 2019-04-23 ENCOUNTER — Ambulatory Visit (INDEPENDENT_AMBULATORY_CARE_PROVIDER_SITE_OTHER): Payer: 59 | Admitting: Interventional Cardiology

## 2019-04-23 ENCOUNTER — Encounter: Payer: Self-pay | Admitting: Interventional Cardiology

## 2019-04-23 ENCOUNTER — Other Ambulatory Visit: Payer: Self-pay

## 2019-04-23 VITALS — BP 144/88 | HR 63 | Ht 68.0 in | Wt 218.4 lb

## 2019-04-23 DIAGNOSIS — I25119 Atherosclerotic heart disease of native coronary artery with unspecified angina pectoris: Secondary | ICD-10-CM | POA: Diagnosis not present

## 2019-04-23 DIAGNOSIS — I10 Essential (primary) hypertension: Secondary | ICD-10-CM

## 2019-04-23 DIAGNOSIS — E669 Obesity, unspecified: Secondary | ICD-10-CM | POA: Diagnosis not present

## 2019-04-23 DIAGNOSIS — E782 Mixed hyperlipidemia: Secondary | ICD-10-CM | POA: Diagnosis not present

## 2019-04-23 DIAGNOSIS — Z6832 Body mass index (BMI) 32.0-32.9, adult: Secondary | ICD-10-CM

## 2019-04-23 LAB — BASIC METABOLIC PANEL
BUN/Creatinine Ratio: 28 — ABNORMAL HIGH (ref 10–24)
BUN: 26 mg/dL (ref 8–27)
CO2: 23 mmol/L (ref 20–29)
Calcium: 9.7 mg/dL (ref 8.6–10.2)
Chloride: 103 mmol/L (ref 96–106)
Creatinine, Ser: 0.93 mg/dL (ref 0.76–1.27)
GFR calc Af Amer: 100 mL/min/{1.73_m2} (ref >59)
GFR calc non Af Amer: 86 mL/min/{1.73_m2} (ref >59)
Glucose: 101 mg/dL — ABNORMAL HIGH (ref 65–99)
Potassium: 4.3 mmol/L (ref 3.5–5.2)
Sodium: 141 mmol/L (ref 134–144)

## 2019-04-23 LAB — PRO B NATRIURETIC PEPTIDE: NT-Pro BNP: 77 pg/mL (ref 0–210)

## 2019-04-23 LAB — CBC
Hematocrit: 44.6 % (ref 37.5–51.0)
Hemoglobin: 14.9 g/dL (ref 13.0–17.7)
MCH: 29.2 pg (ref 26.6–33.0)
MCHC: 33.4 g/dL (ref 31.5–35.7)
MCV: 88 fL (ref 79–97)
Platelets: 279 10*3/uL (ref 150–450)
RBC: 5.1 x10E6/uL (ref 4.14–5.80)
RDW: 12.4 % (ref 11.6–15.4)
WBC: 6.2 10*3/uL (ref 3.4–10.8)

## 2019-04-23 MED ORDER — NITROGLYCERIN 0.4 MG SL SUBL: 0.4000 mg | SUBLINGUAL_TABLET | SUBLINGUAL | 3 refills | Status: AC | PRN

## 2019-04-23 MED ORDER — CLOPIDOGREL BISULFATE 75 MG PO TABS: 75.0000 mg | ORAL_TABLET | Freq: Every day | ORAL | 3 refills | Status: DC

## 2019-04-23 NOTE — Patient Instructions (Addendum)
Medication Instructions:  Your physician recommends that you continue on your current medications as directed. Please refer to the Current Medication list given to you today.  If you need a refill on your cardiac medications before your next appointment, please call your pharmacy.   Lab work: TODAY: CBC, BMET, BNP  If you have labs (blood work) drawn today and your tests are completely normal, you will receive your results only by: Marland Kitchen MyChart Message (if you have MyChart) OR . A paper copy in the mail If you have any lab test that is abnormal or we need to change your treatment, we will call you to review the results.  Testing/Procedures: Your physician has requested that you have a lexiscan myoview. For further information please visit [email or web address] Please follow instruction sheet, as given.   Follow-Up: At [facility], you and your health needs are our priority.  As part of our continuing mission to provide you with exceptional heart care, we have created designated Provider Care Teams.  These Care Teams include your primary Cardiologist (physician) and Advanced Practice Providers (APPs -  Physician Assistants and Nurse Practitioners) who all work together to provide you with the care you need, when you need it. . You will need a follow up appointment in 1 year.  Please call our office 2 months in advance to schedule this appointment.  You may see Everette Rank, MD or one of the following Advanced Practice Providers on your designated Care Team:   . Robbie Lis, PA-C . Dayna Dunn, PA-C . Jacolyn Reedy, PA-C  Any Other Special Instructions Will Be Listed Below (If Applicable).

## 2019-04-25 NOTE — Addendum Note (Signed)
Addended by: Daleen Bo I on: 04/25/2019 01:33 PM   Modules accepted: Orders

## 2019-05-01 ENCOUNTER — Telehealth (HOSPITAL_COMMUNITY): Payer: Self-pay | Admitting: *Deleted

## 2019-05-01 NOTE — Telephone Encounter (Signed)
Patient given detailed instructions per Myocardial Perfusion Study Information Sheet for the test on 05/03/19 at 7:45. Patient notified to arrive 15 minutes early and that it is imperative to arrive on time for appointment to keep from having the test rescheduled.  If you need to cancel or reschedule your appointment, please call the office within 24 hours of your appointment. . Patient verbalized understanding.Brendan Cox

## 2019-05-03 ENCOUNTER — Other Ambulatory Visit: Payer: Self-pay

## 2019-05-03 ENCOUNTER — Ambulatory Visit (HOSPITAL_COMMUNITY): Payer: 59 | Attending: Cardiovascular Disease

## 2019-05-03 DIAGNOSIS — I25119 Atherosclerotic heart disease of native coronary artery with unspecified angina pectoris: Secondary | ICD-10-CM

## 2019-05-03 LAB — MYOCARDIAL PERFUSION IMAGING
LV dias vol: 102 mL (ref 62–150)
LV sys vol: 42 mL
Peak HR: 78 {beats}/min
Rest HR: 60 {beats}/min
SDS: 0
SRS: 0
SSS: 0
TID: 1.09

## 2019-05-03 MED ORDER — TECHNETIUM TC 99M TETROFOSMIN IV KIT
26.6000 | PACK | Freq: Once | INTRAVENOUS | Status: AC | PRN
  Administered 2019-05-03: 26.6 via INTRAVENOUS
  Filled 2019-05-03: qty 27

## 2019-05-03 MED ORDER — REGADENOSON 0.4 MG/5ML IV SOLN
0.4000 mg | Freq: Once | INTRAVENOUS | Status: AC
  Administered 2019-05-03: 0.4 mg via INTRAVENOUS

## 2019-05-03 MED ORDER — TECHNETIUM TC 99M TETROFOSMIN IV KIT
9.0000 | PACK | Freq: Once | INTRAVENOUS | Status: AC | PRN
  Administered 2019-05-03: 9 via INTRAVENOUS
  Filled 2019-05-03: qty 9

## 2019-06-17 DIAGNOSIS — K219 Gastro-esophageal reflux disease without esophagitis: Secondary | ICD-10-CM | POA: Diagnosis not present

## 2019-06-17 DIAGNOSIS — G4700 Insomnia, unspecified: Secondary | ICD-10-CM | POA: Diagnosis not present

## 2019-06-18 ENCOUNTER — Telehealth: Payer: Self-pay | Admitting: Pharmacist

## 2019-06-18 NOTE — Telephone Encounter (Addendum)
Called pt to follow up with lipids - previously discussed with pt over the phone on 03/28/19. Pt did not want to try Nexletol at that time due to fear over myalgias - he is still experiencing some lingering myalgias from when he previously took PCSK9i therapy. He is previously intolerant to rosuvastatin, pravastatin, atorvastatin, Livalo, Welchol, Zetia, and Praluent (severe muscle pain in thighs and stomach issues). He tried previous statins at low doses and 2-3 days a week but did not tolerate these either.   Pt still does not wish to try Nexletol. Advised pt that if he does change his mind and would like to try Nexletol to let us know. He verbalized understanding and had no further questions.

## 2019-08-06 DIAGNOSIS — H903 Sensorineural hearing loss, bilateral: Secondary | ICD-10-CM | POA: Diagnosis not present

## 2019-08-12 DIAGNOSIS — G8929 Other chronic pain: Secondary | ICD-10-CM | POA: Diagnosis not present

## 2019-08-12 DIAGNOSIS — H9192 Unspecified hearing loss, left ear: Secondary | ICD-10-CM | POA: Diagnosis not present

## 2019-09-13 DIAGNOSIS — Z23 Encounter for immunization: Secondary | ICD-10-CM | POA: Diagnosis not present

## 2019-10-07 DIAGNOSIS — H40013 Open angle with borderline findings, low risk, bilateral: Secondary | ICD-10-CM | POA: Diagnosis not present

## 2019-10-07 DIAGNOSIS — H2513 Age-related nuclear cataract, bilateral: Secondary | ICD-10-CM | POA: Diagnosis not present

## 2019-10-07 DIAGNOSIS — H04123 Dry eye syndrome of bilateral lacrimal glands: Secondary | ICD-10-CM | POA: Diagnosis not present

## 2019-10-23 DIAGNOSIS — G894 Chronic pain syndrome: Secondary | ICD-10-CM | POA: Diagnosis not present

## 2019-10-23 DIAGNOSIS — I2510 Atherosclerotic heart disease of native coronary artery without angina pectoris: Secondary | ICD-10-CM | POA: Diagnosis not present

## 2019-10-23 DIAGNOSIS — E782 Mixed hyperlipidemia: Secondary | ICD-10-CM | POA: Diagnosis not present

## 2019-10-23 DIAGNOSIS — Z0000 Encounter for general adult medical examination without abnormal findings: Secondary | ICD-10-CM | POA: Diagnosis not present

## 2019-10-23 DIAGNOSIS — Z23 Encounter for immunization: Secondary | ICD-10-CM | POA: Diagnosis not present

## 2019-10-23 DIAGNOSIS — I10 Essential (primary) hypertension: Secondary | ICD-10-CM | POA: Diagnosis not present

## 2019-10-23 DIAGNOSIS — Z1211 Encounter for screening for malignant neoplasm of colon: Secondary | ICD-10-CM | POA: Diagnosis not present

## 2019-10-23 DIAGNOSIS — G4700 Insomnia, unspecified: Secondary | ICD-10-CM | POA: Diagnosis not present

## 2019-10-23 DIAGNOSIS — G4733 Obstructive sleep apnea (adult) (pediatric): Secondary | ICD-10-CM | POA: Diagnosis not present

## 2019-10-23 DIAGNOSIS — Z5320 Procedure and treatment not carried out because of patient's decision for unspecified reasons: Secondary | ICD-10-CM | POA: Diagnosis not present

## 2019-10-23 DIAGNOSIS — M159 Polyosteoarthritis, unspecified: Secondary | ICD-10-CM | POA: Diagnosis not present

## 2020-01-21 DIAGNOSIS — M159 Polyosteoarthritis, unspecified: Secondary | ICD-10-CM | POA: Diagnosis not present

## 2020-01-21 DIAGNOSIS — G8929 Other chronic pain: Secondary | ICD-10-CM | POA: Diagnosis not present

## 2020-01-21 DIAGNOSIS — K219 Gastro-esophageal reflux disease without esophagitis: Secondary | ICD-10-CM | POA: Diagnosis not present

## 2020-01-28 ENCOUNTER — Ambulatory Visit (INDEPENDENT_AMBULATORY_CARE_PROVIDER_SITE_OTHER): Payer: Medicare Other | Admitting: Family Medicine

## 2020-01-28 ENCOUNTER — Other Ambulatory Visit: Payer: Self-pay

## 2020-01-28 ENCOUNTER — Encounter (INDEPENDENT_AMBULATORY_CARE_PROVIDER_SITE_OTHER): Payer: Self-pay | Admitting: Family Medicine

## 2020-01-28 VITALS — BP 121/78 | HR 55 | Temp 97.8°F | Ht 67.0 in | Wt 215.0 lb

## 2020-01-28 DIAGNOSIS — I2510 Atherosclerotic heart disease of native coronary artery without angina pectoris: Secondary | ICD-10-CM | POA: Diagnosis not present

## 2020-01-28 DIAGNOSIS — R0602 Shortness of breath: Secondary | ICD-10-CM | POA: Diagnosis not present

## 2020-01-28 DIAGNOSIS — E669 Obesity, unspecified: Secondary | ICD-10-CM

## 2020-01-28 DIAGNOSIS — E559 Vitamin D deficiency, unspecified: Secondary | ICD-10-CM | POA: Diagnosis not present

## 2020-01-28 DIAGNOSIS — R5383 Other fatigue: Secondary | ICD-10-CM

## 2020-01-28 DIAGNOSIS — Z0289 Encounter for other administrative examinations: Secondary | ICD-10-CM

## 2020-01-28 DIAGNOSIS — Z1331 Encounter for screening for depression: Secondary | ICD-10-CM | POA: Diagnosis not present

## 2020-01-28 DIAGNOSIS — R739 Hyperglycemia, unspecified: Secondary | ICD-10-CM | POA: Diagnosis not present

## 2020-01-28 DIAGNOSIS — I10 Essential (primary) hypertension: Secondary | ICD-10-CM

## 2020-01-28 DIAGNOSIS — E66811 Obesity, class 1: Secondary | ICD-10-CM

## 2020-01-28 DIAGNOSIS — Z6833 Body mass index (BMI) 33.0-33.9, adult: Secondary | ICD-10-CM | POA: Diagnosis not present

## 2020-01-28 NOTE — Progress Notes (Signed)
**Note Brendan Cox-Identified via Obfuscation** Dear Brendan Celeste. Masneri, DO,   Thank you for referring Brendan Cox to our clinic. The following note includes my evaluation and treatment recommendations.  Chief Complaint:   OBESITY Brendan Cox (MR# [id number]) is a 64 y.o. male who presents for evaluation and treatment of obesity and related comorbidities. Current BMI is Body mass index is 33.67 kg/m. Brendan Cox has been struggling with his weight for many years and has been unsuccessful in either losing weight, maintaining weight loss, or reaching his healthy weight goal.  Brendan Cox is currently in the action stage of change and ready to dedicate time achieving and maintaining a healthier weight. Brendan Cox is interested in becoming our patient and working on intensive lifestyle modifications including (but not limited to) diet and exercise for weight loss.  Brendan Cox's habits were reviewed today and are as follows: His family eats meals together, he thinks his family will eat healthier with him, his desired weight loss is 35 lbs, he started gaining weight about 10 years ago, his heaviest weight ever was 225 pounds, he has significant food cravings issues, he snacks frequently in the evenings, he is frequently drinking liquids with calories, he frequently makes poor food choices and he frequently eats larger portions than normal.  Depression Screen Brendan Cox's Food and Mood (modified PHQ-9) score was 3.  Depression screen Brendan Cox 2/9 01/28/2020  Decreased Interest 0  Down, Depressed, Hopeless 0  PHQ - 2 Score 0  Altered sleeping 1  Tired, decreased energy 1  Change in appetite 0  Feeling bad or failure about yourself  0  Trouble concentrating 1  Moving slowly or fidgety/restless 0  Suicidal thoughts 0  PHQ-9 Score 3  Difficult doing work/chores Not difficult at all   Subjective:   1. Other fatigue Brendan Cox admits to daytime somnolence and admits to waking up still tired. Patent has a history of symptoms of daytime fatigue. Brendan Cox  generally gets 5 or 7 hours of sleep per night, and states that he has difficulty falling asleep. Snoring is present. Apneic episodes are not present. Epworth Sleepiness Score is 1.  2. SOB (shortness of breath) on exertion Brendan Cox notes increasing shortness of breath with exercising and seems to be worsening over time with weight gain. He notes getting out of breath sooner with activity than he used to. This has not gotten worse recently. [name] denies shortness of breath at rest or orthopnea.  3. Coronary artery disease without angina pectoris, unspecified vessel or lesion type, unspecified whether native or transplanted heart Brendan Cox is status post myocardia infarction on ASA and Plavix. He cannot tolerate statins. He is followed by Cardiology.  4. Essential hypertension Brendan Cox's blood pressure is controlled on medications and he is followed by Cardiology.  5. Hyperglycemia Brendan Cox has a history of some elevated glucose readings in the past.  6. Vitamin D deficiency Brendan Cox has a high risk of Vit D deficiency due to weight. He notes fatigue.  Assessment/Plan:   1. Other fatigue Brendan Cox does feel that his weight is causing his energy to be lower than it should be. Fatigue may be related to obesity, depression or many other causes. Labs will be ordered, and in the meanwhile, Brendan Cox will focus on self care including making healthy food choices, increasing physical activity and focusing on stress reduction.  - Brendan Cox - Brendan Cox - Brendan Cox, free - Brendan Cox  2. SOB (shortness of breath) on exertion Brendan Cox does feel that he gets out of breath more easily that he used to  when he exercises. Brendan Cox's shortness of breath appears to be obesity related and exercise induced. He has agreed to work on weight loss and gradually increase exercise to treat his exercise induced shortness of breath. Will continue to monitor closely.  - Brendan Cox - Brendan Cox, free - Brendan Cox  3. Coronary artery disease without angina pectoris,  unspecified vessel or lesion type, unspecified whether native or transplanted heart Brendan Cox will start his diet and goal of weight loss. We will check labs today, and continue to monitor.  - Comprehensive metabolic panel - CBC with Differential/Platelet - Hemoglobin A1c - Insulin, random  4. Essential hypertension Brendan Cox will start his diet, and will work on healthy weight loss and exercise to improve blood pressure control. We will watch for signs of hypotension as he continues his lifestyle modifications. We will check labs today, and will follow up.  5. Hyperglycemia Fasting labs will be obtained today and results with be discussed with Brendan Cox in 2 weeks at his follow up visit. In the meanwhile Brendan Cox was started on a lower simple carbohydrate diet and will work on weight loss efforts.  - Hemoglobin A1c - Insulin, random  6. Vitamin D deficiency Low Vitamin D level contributes to fatigue and are associated with obesity, breast, and colon cancer. We will check labs today. Brendan Cox will follow-up for routine testing of Vitamin D, at least 2-3 times per year to avoid over-replacement.  - VITAMIN D 25 Hydroxy (Vit-D Deficiency, Fractures)  7. Depression screening Brendan Cox had a negative depression screening. Depression is commonly associated with obesity and often results in emotional eating behaviors. We will monitor this closely and work on CBT to help improve the non-hunger eating patterns. Referral to Psychology may be required if no improvement is seen as he continues in our clinic.  8. Class 1 obesity with serious comorbidity and body mass index (BMI) of 33.0 to 33.9 in adult, unspecified obesity type [name] is currently in the action stage of change and his goal is to continue with weight loss efforts. I recommend [name] begin the structured treatment plan as follows:  He has agreed to the Category 3 Plan.  Exercise goals: No exercise has been prescribed at this time.   Behavioral  modification strategies: increasing lean protein intake and meal planning and cooking strategies.  He was informed of the importance of frequent follow-up visits to maximize his success with intensive lifestyle modifications for his multiple health conditions. He was informed we would discuss his lab results at his next visit unless there is a critical issue that needs to be addressed sooner. Kobey agreed to keep his next visit at the agreed upon time to discuss these results.  Objective:   Blood pressure 121/78, pulse (!) 55, temperature 97.8 F (36.6 C), temperature source Oral, height 5\' 7"  (1.702 m), weight 215 lb (97.5 kg), SpO2 96 %. Body mass index is 33.67 kg/m.  Brendan Cox: Normal sinus rhythm, rate 62 BPM.  Indirect Calorimeter completed today shows a VO2 of 252 and a REE of 1752.  His calculated basal metabolic rate is thus his basal metabolic rate is worse than expected.  General: Cooperative, alert, well developed, in no acute distress. HEENT: Conjunctivae and lids unremarkable. Cardiovascular: Regular rhythm.  Lungs: Normal work of breathing. Neurologic: No focal deficits.   Lab Results  Component Value Date   CREATININE 0.93 04/23/2019   BUN 26 04/23/2019   NA 141 04/23/2019   K 4.3 04/23/2019   CL 103 04/23/2019   CO2 23  04/23/2019   Lab Results  Component Value Date   ALT 22 09/21/2015   AST 26 09/21/2015   ALKPHOS 58 09/21/2015   BILITOT 0.8 09/21/2015   No results found for: HGBA1C No results found for: INSULIN No results found for: Brendan Cox No results found for: CHOL, HDL, LDLCALC, LDLDIRECT, TRIG, CHOLHDL Lab Results  Component Value Date   WBC 6.2 04/23/2019   HGB 14.9 04/23/2019   HCT 44.6 04/23/2019   MCV 88 04/23/2019   PLT 279 04/23/2019   No results found for: IRON, TIBC, FERRITIN Obesity Behavioral Intervention Visit Documentation for Insurance:   Approximately 15 minutes were spent on the discussion below.  ASK: We discussed the diagnosis  of obesity with [name] today and Arlen agreed to give [place] permission to discuss obesity behavioral modification therapy today.  ASSESS: Shalom has the diagnosis of obesity and his BMI today is 33.67. Jansen is in the action stage of change.   ADVISE: Cire was educated on the multiple health risks of obesity as well as the benefit of weight loss to improve his health. He was advised of the need for long term treatment and the importance of lifestyle modifications to improve his current health and to decrease his risk of future health problems.  AGREE: Multiple dietary modification options and treatment options were discussed and Rayaan agreed to follow the recommendations documented in the above note.  ARRANGE: Argus was educated on the importance of frequent visits to treat obesity as outlined per CMS and USPSTF guidelines and agreed to schedule his next follow up appointment today.  Attestation Statements:   Reviewed by clinician on day of visit: allergies, medications, problem list, medical history, surgical history, family history, social history, and previous encounter notes.   I, Trixie Dredge, am acting as transcriptionist for Dennard Nip, MD.  I have reviewed the above documentation for accuracy and completeness, and I agree with the above. - Dennard Nip, MD

## 2020-01-29 LAB — COMPREHENSIVE METABOLIC PANEL
ALT: 25 IU/L (ref 0–44)
AST: 24 IU/L (ref 0–40)
Albumin/Globulin Ratio: 1.9 (ref 1.2–2.2)
Albumin: 4.5 g/dL (ref 3.8–4.8)
Alkaline Phosphatase: 79 IU/L (ref 39–117)
BUN/Creatinine Ratio: 21 (ref 10–24)
BUN: 21 mg/dL (ref 8–27)
Bilirubin Total: 0.6 mg/dL (ref 0.0–1.2)
CO2: 23 mmol/L (ref 20–29)
Calcium: 9.8 mg/dL (ref 8.6–10.2)
Chloride: 105 mmol/L (ref 96–106)
Creatinine, Ser: 0.99 mg/dL (ref 0.76–1.27)
GFR calc Af Amer: 92 mL/min/{1.73_m2} (ref >59)
GFR calc non Af Amer: 80 mL/min/{1.73_m2} (ref >59)
Globulin, Total: 2.4 g/dL (ref 1.5–4.5)
Glucose: 103 mg/dL — ABNORMAL HIGH (ref 65–99)
Potassium: 4.2 mmol/L (ref 3.5–5.2)
Sodium: 140 mmol/L (ref 134–144)
Total Protein: 6.9 g/dL (ref 6.0–8.5)

## 2020-01-29 LAB — CBC WITH DIFFERENTIAL/PLATELET
Basophils Absolute: 0.1 10*3/uL (ref 0.0–0.2)
Basos: 1 %
EOS (ABSOLUTE): 0.4 10*3/uL (ref 0.0–0.4)
Eos: 6 %
Hematocrit: 46.5 % (ref 37.5–51.0)
Hemoglobin: 15.8 g/dL (ref 13.0–17.7)
Immature Grans (Abs): 0 10*3/uL (ref 0.0–0.1)
Immature Granulocytes: 0 %
Lymphocytes Absolute: 2.1 10*3/uL (ref 0.7–3.1)
Lymphs: 32 %
MCH: 29.3 pg (ref 26.6–33.0)
MCHC: 34 g/dL (ref 31.5–35.7)
MCV: 86 fL (ref 79–97)
Monocytes Absolute: 0.6 10*3/uL (ref 0.1–0.9)
Monocytes: 9 %
Neutrophils Absolute: 3.3 10*3/uL (ref 1.4–7.0)
Neutrophils: 52 %
Platelets: 276 10*3/uL (ref 150–450)
RBC: 5.39 x10E6/uL (ref 4.14–5.80)
RDW: 13 % (ref 11.6–15.4)
WBC: 6.5 10*3/uL (ref 3.4–10.8)

## 2020-01-29 LAB — VITAMIN D 25 HYDROXY (VIT D DEFICIENCY, FRACTURES): Vit D, 25-Hydroxy: 17.9 ng/mL — ABNORMAL LOW (ref 30.0–100.0)

## 2020-01-29 LAB — TSH: TSH: 0.956 u[IU]/mL (ref 0.450–4.500)

## 2020-01-29 LAB — HEMOGLOBIN A1C
Est. average glucose Bld gHb Est-mCnc: 114 mg/dL
Hgb A1c MFr Bld: 5.6 % (ref 4.8–5.6)

## 2020-01-29 LAB — T4, FREE: Free T4: 1.32 ng/dL (ref 0.82–1.77)

## 2020-01-29 LAB — T3: T3, Total: 123 ng/dL (ref 71–180)

## 2020-01-29 LAB — INSULIN, RANDOM: INSULIN: 7.8 u[IU]/mL (ref 2.6–24.9)

## 2020-01-30 ENCOUNTER — Encounter (INDEPENDENT_AMBULATORY_CARE_PROVIDER_SITE_OTHER): Payer: Self-pay | Admitting: Family Medicine

## 2020-01-30 NOTE — Telephone Encounter (Signed)
Please advise 

## 2020-02-11 ENCOUNTER — Encounter (INDEPENDENT_AMBULATORY_CARE_PROVIDER_SITE_OTHER): Payer: Self-pay | Admitting: Family Medicine

## 2020-02-11 ENCOUNTER — Other Ambulatory Visit: Payer: Self-pay

## 2020-02-11 ENCOUNTER — Ambulatory Visit (INDEPENDENT_AMBULATORY_CARE_PROVIDER_SITE_OTHER): Payer: Medicare Other | Admitting: Family Medicine

## 2020-02-11 VITALS — BP 124/76 | HR 81 | Temp 97.9°F | Ht 67.0 in | Wt 213.0 lb

## 2020-02-11 DIAGNOSIS — E559 Vitamin D deficiency, unspecified: Secondary | ICD-10-CM | POA: Diagnosis not present

## 2020-02-11 DIAGNOSIS — E669 Obesity, unspecified: Secondary | ICD-10-CM | POA: Diagnosis not present

## 2020-02-11 DIAGNOSIS — R7303 Prediabetes: Secondary | ICD-10-CM

## 2020-02-11 DIAGNOSIS — Z6833 Body mass index (BMI) 33.0-33.9, adult: Secondary | ICD-10-CM

## 2020-02-11 MED ORDER — VITAMIN D (ERGOCALCIFEROL) 1.25 MG (50000 UNIT) PO CAPS: 50000.0000 [IU] | ORAL_CAPSULE | ORAL | 0 refills | Status: DC

## 2020-02-11 NOTE — Progress Notes (Signed)
Chief Complaint:   OBESITY Brendan Cox is here to discuss his progress with his obesity treatment plan along with follow-up of his obesity related diagnoses. Brendan Cox is on the Category 3 Plan and states he is following his eating plan approximately 90-95% of the time. Brendan Cox states he is active while doing housework.  Today's visit was #: 2 Starting weight: 215 lbs Starting date: 01/28/2020 Today's weight: 213 lbs Today's date: 02/11/2020 Total lbs lost to date: 2 Total lbs lost since last in-office visit: 2  Interim History: Clearance did well with weight loss on his Category 3 plan. He really missed his more creative cooking with pasta and sauces, and he feels this plan wont fit him well in the long run.  Subjective:   1. Vitamin D deficiency Brendan Cox has a new diagnosis of Vit D deficiency. His Vit D level is very low. I discussed labs with the patient today.  2. Pre-diabetes Brendan Cox's glucose and A1c are elevated. He notes polyphagia. He has a history of enjoying simple carbohydrates. I discussed labs with the patient today.  Assessment/Plan:   1. Vitamin D deficiency Low Vitamin D level contributes to fatigue and are associated with obesity, breast, and colon cancer. [name] agreed to start prescription Vitamin D 50,000 IU every week with no refill. He will follow-up for routine testing of Vitamin D, at least 2-3 times per year to avoid over-replacement.  - Vitamin D, Ergocalciferol, (DRISDOL) 1.25 MG (50000 UNIT) CAPS capsule; Take 1 capsule (50,000 Units total) by mouth every 7 (seven) days.  Dispense: 4 capsule; Refill: 0  2. Pre-diabetes [name] will continue to work on weight loss, diet, exercise, and decreasing simple carbohydrates to help decrease the risk of diabetes. We will recheck labs in 3 months.  3. Class 1 obesity with serious comorbidity and body mass index (BMI) of 33.0 to 33.9 in adult, unspecified obesity type Brendan Cox is currently in the action stage of change. As  such, his goal is to continue with weight loss efforts. He has agreed to change to keeping a food journal and adhering to recommended goals of 1200-1700 calories and 85+ grams of protein daily.   Exercise goals: Brendan Cox is to continue his current activities as is.  Behavioral modification strategies: increasing lean protein intake and keeping healthy foods in the home.  Brendan Cox has agreed to follow-up with our clinic in 2 weeks. He was informed of the importance of frequent follow-up visits to maximize his success with intensive lifestyle modifications for his multiple health conditions.   Objective:   Blood pressure 124/76, pulse 81, temperature 97.9 F (36.6 C), temperature source Oral, height 5\' 7"  (1.702 m), weight 213 lb (96.6 kg), SpO2 94 %. Body mass index is 33.36 kg/m.  General: Cooperative, alert, well developed, in no acute distress. HEENT: Conjunctivae and lids unremarkable. Cardiovascular: Regular rhythm.  Lungs: Normal work of breathing. Neurologic: No focal deficits.   Lab Results  Component Value Date   CREATININE 0.99 01/28/2020   BUN 21 01/28/2020   NA 140 01/28/2020   K 4.2 01/28/2020   CL 105 01/28/2020   CO2 23 01/28/2020   Lab Results  Component Value Date   ALT 25 01/28/2020   AST 24 01/28/2020   ALKPHOS 79 01/28/2020   BILITOT 0.6 01/28/2020   Lab Results  Component Value Date   HGBA1C 5.6 01/28/2020   Lab Results  Component Value Date   INSULIN 7.8 01/28/2020   Lab Results  Component Value Date  TSH 0.956 01/28/2020   No results found for: CHOL, HDL, LDLCALC, LDLDIRECT, TRIG, CHOLHDL Lab Results  Component Value Date   WBC 6.5 01/28/2020   HGB 15.8 01/28/2020   HCT 46.5 01/28/2020   MCV 86 01/28/2020   PLT 276 01/28/2020   No results found for: IRON, TIBC, FERRITIN  Attestation Statements:   Reviewed by clinician on day of visit: allergies, medications, problem list, medical history, surgical history, family history, social  history, and previous encounter notes.  Time spent on visit including pre-visit chart review and post-visit charting and care was 45 minutes.    I, Trixie Dredge, am acting as transcriptionist for Dennard Nip, MD.  I have reviewed the above documentation for accuracy and completeness, and I agree with the above. -  Dennard Nip, MD

## 2020-02-25 ENCOUNTER — Other Ambulatory Visit: Payer: Self-pay

## 2020-02-25 ENCOUNTER — Encounter (INDEPENDENT_AMBULATORY_CARE_PROVIDER_SITE_OTHER): Payer: Self-pay | Admitting: Physician Assistant

## 2020-02-25 ENCOUNTER — Ambulatory Visit (INDEPENDENT_AMBULATORY_CARE_PROVIDER_SITE_OTHER): Payer: Medicare Other | Admitting: Physician Assistant

## 2020-02-25 VITALS — BP 122/72 | HR 69 | Temp 98.0°F | Ht 67.0 in | Wt 210.0 lb

## 2020-02-25 DIAGNOSIS — I10 Essential (primary) hypertension: Secondary | ICD-10-CM | POA: Diagnosis not present

## 2020-02-25 DIAGNOSIS — E559 Vitamin D deficiency, unspecified: Secondary | ICD-10-CM

## 2020-02-25 DIAGNOSIS — E669 Obesity, unspecified: Secondary | ICD-10-CM | POA: Diagnosis not present

## 2020-02-25 DIAGNOSIS — Z6833 Body mass index (BMI) 33.0-33.9, adult: Secondary | ICD-10-CM

## 2020-02-25 NOTE — Progress Notes (Signed)
Chief Complaint:   OBESITY STPEHEN Cox is here to discuss his progress with his obesity treatment plan along with follow-up of his obesity related diagnoses. Brendan Cox is keeping a food journal and adhering to recommended goals of 1200-1700 calories and 85 grams of protein and states he is following his eating plan approximately 90-95% of the time. Brendan Cox states he is walking 7,500-14,000 steps daily.   Today's visit was #: 3 Starting weight: 215 lbs Starting date: 01/28/2020 Today's weight: 210 lbs Today's date: 02/25/2020 Total lbs lost to date: 5 Total lbs lost since last in-office visit: 3  Interim History: Brendan Cox has been keeping track of his calories, but not his protein. He notes feeling hungry intermittently.  Subjective:   Essential hypertension. Brendan Cox is on HCTZ. Blood pressure is normal today. No headache. No chest pain.  BP Readings from Last 3 Encounters:  02/25/20 122/72  02/11/20 124/76  01/28/20 121/78   Lab Results  Component Value Date   CREATININE 0.99 01/28/2020   CREATININE 0.93 04/23/2019   CREATININE 0.92 09/21/2015   Vitamin D deficiency. Brendan Cox is on Vitamin D weekly. No nausea, vomiting, or muscle weakness. Last Vitamin D 17.9 on 01/28/2020.  Assessment/Plan:   Essential hypertension. Brendan Cox is working on healthy weight loss and exercise to improve blood pressure control. We will watch for signs of hypotension as he continues his lifestyle modifications. He will continue his medications as directed.  Vitamin D deficiency. Low Vitamin D level contributes to fatigue and are associated with obesity, breast, and colon cancer. He agrees to continue to take Vitamin D and will follow-up for routine testing of Vitamin D, at least 2-3 times per year to avoid over-replacement.  Class 1 obesity with serious comorbidity and body mass index (BMI) of 33.0 to 33.9 in adult, unspecified obesity type.  Brendan Cox is currently in the action stage of change. As  such, his goal is to continue with weight loss efforts. He has agreed to keeping a food journal and adhering to recommended goals of 1200-1700 calories and 85 grams of protein daily.   Exercise goals: Older adults should follow the adult guidelines. When older adults cannot meet the adult guidelines, they should be as physically active as their abilities and conditions will allow.   Behavioral modification strategies: meal planning and cooking strategies and keeping healthy foods in the home.  Brendan Cox has agreed to follow-up with our clinic in 2 weeks. He was informed of the importance of frequent follow-up visits to maximize his success with intensive lifestyle modifications for his multiple health conditions.   Objective:   Blood pressure 122/72, pulse 69, temperature 98 F (36.7 C), temperature source Oral, height 5\' 7"  (1.702 m), weight 210 lb (95.3 kg), SpO2 97 %. Body mass index is 32.89 kg/m.  General: Cooperative, alert, well developed, in no acute distress. HEENT: Conjunctivae and lids unremarkable. Cardiovascular: Regular rhythm.  Lungs: Normal work of breathing. Neurologic: No focal deficits.   Lab Results  Component Value Date   CREATININE 0.99 01/28/2020   BUN 21 01/28/2020   NA 140 01/28/2020   K 4.2 01/28/2020   CL 105 01/28/2020   CO2 23 01/28/2020   Lab Results  Component Value Date   ALT 25 01/28/2020   AST 24 01/28/2020   ALKPHOS 79 01/28/2020   BILITOT 0.6 01/28/2020   Lab Results  Component Value Date   HGBA1C 5.6 01/28/2020   Lab Results  Component Value Date   INSULIN 7.8 01/28/2020  Lab Results  Component Value Date   TSH 0.956 01/28/2020   No results found for: CHOL, HDL, LDLCALC, LDLDIRECT, TRIG, CHOLHDL Lab Results  Component Value Date   WBC 6.5 01/28/2020   HGB 15.8 01/28/2020   HCT 46.5 01/28/2020   MCV 86 01/28/2020   PLT 276 01/28/2020   No results found for: IRON, TIBC, FERRITIN  Obesity Behavioral Intervention Documentation  for Insurance:   Approximately 15 minutes were spent on the discussion below.  ASK: We discussed the diagnosis of obesity with Brendan Cox today and Brendan Cox agreed to give [place] permission to discuss obesity behavioral modification therapy today.  ASSESS: Brendan Cox has the diagnosis of obesity and his BMI today is 33.0. Brendan Cox is in the action stage of change.   ADVISE: Brendan Cox was educated on the multiple health risks of obesity as well as the benefit of weight loss to improve his health. He was advised of the need for long term treatment and the importance of lifestyle modifications to improve his current health and to decrease his risk of future health problems.  AGREE: Multiple dietary modification options and treatment options were discussed and Brendan Cox agreed to follow the recommendations documented in the above note.  ARRANGE: Brendan Cox was educated on the importance of frequent visits to treat obesity as outlined per CMS and USPSTF guidelines and agreed to schedule his next follow up appointment today.  Attestation Statements:   Reviewed by clinician on day of visit: allergies, medications, problem list, medical history, surgical history, family history, social history, and previous encounter notes.  Brendan Cox, am acting as transcriptionist for Abby Potash, PA-C   I have reviewed the above documentation for accuracy and completeness, and I agree with the above. Abby Potash, PA-C

## 2020-03-11 ENCOUNTER — Other Ambulatory Visit: Payer: Self-pay

## 2020-03-11 ENCOUNTER — Ambulatory Visit (INDEPENDENT_AMBULATORY_CARE_PROVIDER_SITE_OTHER): Payer: Medicare Other | Admitting: Family Medicine

## 2020-03-11 ENCOUNTER — Encounter (INDEPENDENT_AMBULATORY_CARE_PROVIDER_SITE_OTHER): Payer: Self-pay | Admitting: Family Medicine

## 2020-03-11 VITALS — BP 135/72 | HR 70 | Temp 97.6°F | Ht 67.0 in | Wt 208.0 lb

## 2020-03-11 DIAGNOSIS — E669 Obesity, unspecified: Secondary | ICD-10-CM

## 2020-03-11 DIAGNOSIS — Z6832 Body mass index (BMI) 32.0-32.9, adult: Secondary | ICD-10-CM | POA: Diagnosis not present

## 2020-03-11 DIAGNOSIS — I10 Essential (primary) hypertension: Secondary | ICD-10-CM | POA: Diagnosis not present

## 2020-03-11 DIAGNOSIS — E559 Vitamin D deficiency, unspecified: Secondary | ICD-10-CM | POA: Diagnosis not present

## 2020-03-11 MED ORDER — VITAMIN D (ERGOCALCIFEROL) 1.25 MG (50000 UNIT) PO CAPS: 50000.0000 [IU] | ORAL_CAPSULE | ORAL | 0 refills | Status: DC

## 2020-03-12 NOTE — Progress Notes (Signed)
Chief Complaint:   OBESITY Brendan Cox is here to discuss his progress with his obesity treatment plan along with follow-up of his obesity related diagnoses. Brendan Cox is on keeping a food journal and adhering to recommended goals of 1200-1700 calories and 85 grams of protein daily and states he is following his eating plan approximately 90-95% of the time. Brendan Cox states he is working outside doing yard work, and walking 8,000 steps 7 times per week.  Today's visit was #: 4 Starting weight: 215 lbs Starting date: 01/28/2020 Today's weight: 208 lbs Today's date: 03/11/2020 Total lbs lost to date: 7 Total lbs lost since last in-office visit: 2  Interim History: Brendan Cox has steadily been increasing overall protein intake which has decreased afternoon and evening polyphagia. He reports increase in waking hunger. I encouraged him to "front load" protein, strive to consume 30 grams of protein before lunch. He has upcoming travel to visit family in the next 2 weeks.  Subjective:   1. Essential hypertension Rosaire currently taking metoprolol XL 50 mg q daily, hydrochlorothiazide 25 mg q daily. He denies recent use of nitrostat. He denies chest pain or dyspnea.  2. Vitamin D deficiency Brendan Cox's Vit D level was 17.9 on 01/28/2020. His goal is >50. He is currently on prescription strength Vit D supplementation.  Assessment/Plan:   1. Essential hypertension Brendan Cox is working on healthy weight loss and exercise to improve blood pressure control. We will watch for signs of hypotension as he continues his lifestyle modifications. Brendan Cox agreed to continue his current anti-hypertensive therapy.  2. Vitamin D deficiency Low Vitamin D level contributes to fatigue and are associated with obesity, breast, and colon cancer. We will refill prescription Vitamin D for 1 month. Brendan Cox will follow-up for routine testing of Vitamin D, at least 2-3 times per year to avoid over-replacement. We will recheck labs in 2  months.  - Vitamin D, Ergocalciferol, (DRISDOL) 1.25 MG (50000 UNIT) CAPS capsule; Take 1 capsule (50,000 Units total) by mouth every 7 (seven) days.  Dispense: 4 capsule; Refill: 0  3. Class 1 obesity with serious comorbidity and body mass index (BMI) of 32.0 to 32.9 in adult, unspecified obesity type Brendan Cox is currently in the action stage of change. As such, his goal is to continue with weight loss efforts. He has agreed to keeping a food journal and adhering to recommended goals of 1200-1700 calories and 85+ grams of protein daily.   Exercise goals: As is.  Behavioral modification strategies: increasing lean protein intake, better snacking choices and travel eating strategies.  Brendan Cox has agreed to follow-up with our clinic in 3 weeks. He was informed of the importance of frequent follow-up visits to maximize his success with intensive lifestyle modifications for his multiple health conditions.   Objective:   Blood pressure 135/72, pulse 70, temperature 97.6 F (36.4 C), temperature source Oral, height 5\' 7"  (1.702 m), weight 208 lb (94.3 kg), SpO2 96 %. Body mass index is 32.58 kg/m.  General: Cooperative, alert, well developed, in no acute distress. HEENT: Conjunctivae and lids unremarkable. Cardiovascular: Regular rhythm.  Lungs: Normal work of breathing. Neurologic: No focal deficits.   Lab Results  Component Value Date   CREATININE 0.99 01/28/2020   BUN 21 01/28/2020   NA 140 01/28/2020   K 4.2 01/28/2020   CL 105 01/28/2020   CO2 23 01/28/2020   Lab Results  Component Value Date   ALT 25 01/28/2020   AST 24 01/28/2020   ALKPHOS 79 01/28/2020  BILITOT 0.6 01/28/2020   Lab Results  Component Value Date   HGBA1C 5.6 01/28/2020   Lab Results  Component Value Date   INSULIN 7.8 01/28/2020   Lab Results  Component Value Date   TSH 0.956 01/28/2020   No results found for: CHOL, HDL, LDLCALC, LDLDIRECT, TRIG, CHOLHDL Lab Results  Component Value Date   WBC  6.5 01/28/2020   HGB 15.8 01/28/2020   HCT 46.5 01/28/2020   MCV 86 01/28/2020   PLT 276 01/28/2020   No results found for: IRON, TIBC, FERRITIN  Obesity Behavioral Intervention Documentation for Insurance:   Approximately 15 minutes were spent on the discussion below.  ASK: We discussed the diagnosis of obesity with Brendan Cox today and Brendan Cox agreed to give [place] permission to discuss obesity behavioral modification therapy today.  ASSESS: Brendan Cox has the diagnosis of obesity and his BMI today is 32.57. Brendan Cox is in the action stage of change.   ADVISE: Brendan Cox was educated on the multiple health risks of obesity as well as the benefit of weight loss to improve his health. He was advised of the need for long term treatment and the importance of lifestyle modifications to improve his current health and to decrease his risk of future health problems.  AGREE: Multiple dietary modification options and treatment options were discussed and Brendan Cox agreed to follow the recommendations documented in the above note.  ARRANGE: Brendan Cox was educated on the importance of frequent visits to treat obesity as outlined per CMS and USPSTF guidelines and agreed to schedule his next follow up appointment today.  Attestation Statements:   Reviewed by clinician on day of visit: allergies, medications, problem list, medical history, surgical history, family history, social history, and previous encounter notes.   I, Brendan Cox, am acting as transcriptionist for Brendan Nip, MD.  I have reviewed the above documentation for accuracy and completeness, and I agree with the above. -  Brendan Nip, MD

## 2020-04-01 ENCOUNTER — Ambulatory Visit (INDEPENDENT_AMBULATORY_CARE_PROVIDER_SITE_OTHER): Payer: Medicare Other | Admitting: Family Medicine

## 2020-04-01 ENCOUNTER — Encounter (INDEPENDENT_AMBULATORY_CARE_PROVIDER_SITE_OTHER): Payer: Self-pay | Admitting: Family Medicine

## 2020-04-01 ENCOUNTER — Other Ambulatory Visit: Payer: Self-pay

## 2020-04-01 VITALS — BP 135/78 | HR 61 | Temp 98.5°F | Ht 67.0 in | Wt 204.0 lb

## 2020-04-01 DIAGNOSIS — E669 Obesity, unspecified: Secondary | ICD-10-CM

## 2020-04-01 DIAGNOSIS — M25552 Pain in left hip: Secondary | ICD-10-CM | POA: Diagnosis not present

## 2020-04-01 DIAGNOSIS — Z6832 Body mass index (BMI) 32.0-32.9, adult: Secondary | ICD-10-CM

## 2020-04-01 DIAGNOSIS — I10 Essential (primary) hypertension: Secondary | ICD-10-CM | POA: Diagnosis not present

## 2020-04-01 DIAGNOSIS — E559 Vitamin D deficiency, unspecified: Secondary | ICD-10-CM | POA: Diagnosis not present

## 2020-04-01 DIAGNOSIS — M25551 Pain in right hip: Secondary | ICD-10-CM

## 2020-04-01 DIAGNOSIS — I2510 Atherosclerotic heart disease of native coronary artery without angina pectoris: Secondary | ICD-10-CM | POA: Diagnosis not present

## 2020-04-01 MED ORDER — VITAMIN D (ERGOCALCIFEROL) 1.25 MG (50000 UNIT) PO CAPS: 50000.0000 [IU] | ORAL_CAPSULE | ORAL | 0 refills | Status: DC

## 2020-04-01 NOTE — Progress Notes (Signed)
Chief Complaint:   OBESITY Adedamola is here to discuss his progress with his obesity treatment plan along with follow-up of his obesity related diagnoses. Laymon is on keeping a food journal and adhering to recommended goals of 1200-1700 calories and 85+ grams of protein and states he is following his eating plan approximately 85-90% of the time. Khoa states he is walking 8000-10,000 steps 5 times per week.  Today's visit was #: 5 Starting weight: 215 lbs Starting date: 01/28/2020 Today's weight: 204 lbs Today's date: 04/01/2020 Total lbs lost to date: 11 lbs Total lbs lost since last in-office visit: 4 lbs  Interim History: Matteus says he is following the guidelines well.  He prefers more options for meals, so he likes the journaling option.  Subjective:   1. Vitamin D deficiency Mykale's Vitamin D level was 17.9 on 01/28/2020. He is currently taking prescription vitamin D 50,000 IU each week. He denies nausea, vomiting or muscle weakness.  2. Essential hypertension Review: taking medications as instructed, no medication side effects noted, no chest pain on exertion, no dyspnea on exertion, no swelling of ankles.  He is taking metoprolol and HCTZ for blood pressure.  BP Readings from Last 3 Encounters:  04/01/20 135/78  03/11/20 135/72  02/25/20 122/72   3. Bilateral hip pain Tight hips versus hip bursitis.  Assessment/Plan:   1. Vitamin D deficiency Low Vitamin D level contributes to fatigue and are associated with obesity, breast, and colon cancer. He agrees to continue to take prescription Vitamin D @50 ,000 IU every week and will follow-up for routine testing of Vitamin D, at least 2-3 times per year to avoid over-replacement.  Orders - Vitamin D, Ergocalciferol, (DRISDOL) 1.25 MG (50000 UNIT) CAPS capsule; Take 1 capsule (50,000 Units total) by mouth every 7 (seven) days.  Dispense: 4 capsule; Refill: 0  2. Essential hypertension Vahan is working on healthy weight  loss and exercise to improve blood pressure control. We will watch for signs of hypotension as he continues his lifestyle modifications.  3. Bilateral hip pain Will follow because mobility and pain control are important for weight management.  We discussed PT.  4. Class 1 obesity with serious comorbidity and body mass index (BMI) of 32.0 to 32.9 in adult, unspecified obesity type Cai is currently in the action stage of change. As such, his goal is to continue with weight loss efforts. He has agreed to keeping a food journal and adhering to recommended goals of 1200-1700 calories and 85 grams of protein.   Exercise goals: As is.  Behavioral modification strategies: increasing lean protein intake and increasing water intake.  Viyan has agreed to follow-up with our clinic in 2 weeks. He was informed of the importance of frequent follow-up visits to maximize his success with intensive lifestyle modifications for his multiple health conditions.   Objective:   Blood pressure 135/78, pulse 61, temperature 98.5 F (36.9 C), temperature source Oral, height 5\' 7"  (1.702 m), weight 204 lb (92.5 kg), SpO2 95 %. Body mass index is 31.95 kg/m.  General: Cooperative, alert, well developed, in no acute distress. HEENT: Conjunctivae and lids unremarkable. Cardiovascular: Regular rhythm.  Lungs: Normal work of breathing. Neurologic: No focal deficits.   Lab Results  Component Value Date   CREATININE 0.99 01/28/2020   BUN 21 01/28/2020   NA 140 01/28/2020   K 4.2 01/28/2020   CL 105 01/28/2020   CO2 23 01/28/2020   Lab Results  Component Value Date   ALT 25  01/28/2020   AST 24 01/28/2020   ALKPHOS 79 01/28/2020   BILITOT 0.6 01/28/2020   Lab Results  Component Value Date   HGBA1C 5.6 01/28/2020   Lab Results  Component Value Date   INSULIN 7.8 01/28/2020   Lab Results  Component Value Date   TSH 0.956 01/28/2020   Lab Results  Component Value Date   WBC 6.5 01/28/2020   HGB  15.8 01/28/2020   HCT 46.5 01/28/2020   MCV 86 01/28/2020   PLT 276 01/28/2020   Obesity Behavioral Intervention:   Approximately 15 minutes were spent on the discussion below.  ASK: We discussed the diagnosis of obesity with [name] today and Hilding agreed to give [place] permission to discuss obesity behavioral modification therapy today.  ASSESS: Wing has the diagnosis of obesity and his BMI today is 32.0. Alic is in the action stage of change.   ADVISE: Gilverto was educated on the multiple health risks of obesity as well as the benefit of weight loss to improve his health. He was advised of the need for long term treatment and the importance of lifestyle modifications to improve his current health and to decrease his risk of future health problems.  AGREE: Multiple dietary modification options and treatment options were discussed and Covey agreed to follow the recommendations documented in the above note.  ARRANGE: Akashdeep was educated on the importance of frequent visits to treat obesity as outlined per CMS and USPSTF guidelines and agreed to schedule his next follow up appointment today.  Attestation Statements:   Reviewed by clinician on day of visit: allergies, medications, problem list, medical history, surgical history, family history, social history, and previous encounter notes.  I, Water quality scientist, CMA, am acting as Location manager for [employer], DO.  I have reviewed the above documentation for accuracy and completeness, and I agree with the above. Briscoe Deutscher, DO

## 2020-04-15 DIAGNOSIS — M545 Low back pain: Secondary | ICD-10-CM | POA: Diagnosis not present

## 2020-04-15 DIAGNOSIS — G894 Chronic pain syndrome: Secondary | ICD-10-CM | POA: Diagnosis not present

## 2020-04-15 DIAGNOSIS — M159 Polyosteoarthritis, unspecified: Secondary | ICD-10-CM | POA: Diagnosis not present

## 2020-04-16 ENCOUNTER — Other Ambulatory Visit: Payer: Self-pay | Admitting: Interventional Cardiology

## 2020-04-22 ENCOUNTER — Encounter (INDEPENDENT_AMBULATORY_CARE_PROVIDER_SITE_OTHER): Payer: Self-pay | Admitting: Family Medicine

## 2020-04-22 ENCOUNTER — Ambulatory Visit (INDEPENDENT_AMBULATORY_CARE_PROVIDER_SITE_OTHER): Payer: Medicare Other | Admitting: Family Medicine

## 2020-04-22 ENCOUNTER — Other Ambulatory Visit: Payer: Self-pay

## 2020-04-22 VITALS — BP 136/75 | HR 62 | Temp 97.9°F | Ht 67.0 in | Wt 202.0 lb

## 2020-04-22 DIAGNOSIS — E559 Vitamin D deficiency, unspecified: Secondary | ICD-10-CM | POA: Diagnosis not present

## 2020-04-22 DIAGNOSIS — I10 Essential (primary) hypertension: Secondary | ICD-10-CM | POA: Diagnosis not present

## 2020-04-22 DIAGNOSIS — E669 Obesity, unspecified: Secondary | ICD-10-CM

## 2020-04-22 DIAGNOSIS — Z6831 Body mass index (BMI) 31.0-31.9, adult: Secondary | ICD-10-CM | POA: Diagnosis not present

## 2020-04-22 MED ORDER — VITAMIN D (ERGOCALCIFEROL) 1.25 MG (50000 UNIT) PO CAPS: 50000.0000 [IU] | ORAL_CAPSULE | ORAL | 0 refills | Status: DC

## 2020-04-22 NOTE — Progress Notes (Signed)
Chief Complaint:   OBESITY Brendan Cox is here to discuss his progress with his obesity treatment plan along with follow-up of his obesity related diagnoses. Kamdon is on keeping a food journal and adhering to recommended goals of 1200-1700 calories and 85 grams of protein daily and states he is following his eating plan approximately 60-70% of the time. Pearlie states he is 7,000-9,000 steps 7 times per week.  Today's visit was #: 6 Starting weight: 215 lbs Starting date: 01/28/2020 Today's weight: 202 lbs Today's date: 04/22/2020 Total lbs lost to date: 13 Total lbs lost since last in-office visit: 2  Interim History: Braelin continues to do well with weight loss but did struggle a bit with journaling as consistently but has gotten back on track.  Subjective:   1. Vitamin D deficiency Abdulahi is stable on Vit D, and he denies nausea, vomiting, or muscle weakness. His Vit D level is not yet at goal.  2. Essential hypertension Nash's blood pressure is stable on his medications. He denies lightheadedness. He is working on diet and weight loss.  Assessment/Plan:   1. Vitamin D deficiency Low Vitamin D level contributes to fatigue and are associated with obesity, breast, and colon cancer. We will refill prescription Vitamin D for 1 month. Mael will follow-up for routine testing of Vitamin D, at least 2-3 times per year to avoid over-replacement. We will recheck labs in 1-2 months.  - Vitamin D, Ergocalciferol, (DRISDOL) 1.25 MG (50000 UNIT) CAPS capsule; Take 1 capsule (50,000 Units total) by mouth every 7 (seven) days.  Dispense: 4 capsule; Refill: 0  2. Essential hypertension Yassine is working on healthy weight loss, diet, and exercise to improve blood pressure control. We will watch for signs of hypotension as he continues his lifestyle modifications. We will recheck labs in 1-2 months.  3. Class 1 obesity with serious comorbidity and body mass index (BMI) of 31.0 to 31.9 in adult,  unspecified obesity type Iver is currently in the action stage of change. As such, his goal is to continue with weight loss efforts. He has agreed to keeping a food journal and adhering to recommended goals of 1200-1700 calories and 85+ grams of protein daily.   Exercise goals: As is.  Behavioral modification strategies: increasing lean protein intake and meal planning and cooking strategies.  Cordai has agreed to follow-up with our clinic in 3 weeks. He was informed of the importance of frequent follow-up visits to maximize his success with intensive lifestyle modifications for his multiple health conditions.   Objective:   Blood pressure 136/75, pulse 62, temperature 97.9 F (36.6 C), temperature source Oral, height 5\' 7"  (1.702 m), weight 202 lb (91.6 kg), SpO2 97 %. Body mass index is 31.64 kg/m.  General: Cooperative, alert, well developed, in no acute distress. HEENT: Conjunctivae and lids unremarkable. Cardiovascular: Regular rhythm.  Lungs: Normal work of breathing. Neurologic: No focal deficits.   Lab Results  Component Value Date   CREATININE 0.99 01/28/2020   BUN 21 01/28/2020   NA 140 01/28/2020   K 4.2 01/28/2020   CL 105 01/28/2020   CO2 23 01/28/2020   Lab Results  Component Value Date   ALT 25 01/28/2020   AST 24 01/28/2020   ALKPHOS 79 01/28/2020   BILITOT 0.6 01/28/2020   Lab Results  Component Value Date   HGBA1C 5.6 01/28/2020   Lab Results  Component Value Date   INSULIN 7.8 01/28/2020   Lab Results  Component Value Date  TSH 0.956 01/28/2020   No results found for: CHOL, HDL, LDLCALC, LDLDIRECT, TRIG, CHOLHDL Lab Results  Component Value Date   WBC 6.5 01/28/2020   HGB 15.8 01/28/2020   HCT 46.5 01/28/2020   MCV 86 01/28/2020   PLT 276 01/28/2020   No results found for: IRON, TIBC, FERRITIN  Obesity Behavioral Intervention Documentation for Insurance:   Approximately 15 minutes were spent on the discussion below.  ASK: We  discussed the diagnosis of obesity with [name] today and Breland agreed to give [place] permission to discuss obesity behavioral modification therapy today.  ASSESS: Bailey has the diagnosis of obesity and his BMI today is 31.63. Eugen is in the action stage of change.   ADVISE: Jaison was educated on the multiple health risks of obesity as well as the benefit of weight loss to improve his health. He was advised of the need for long term treatment and the importance of lifestyle modifications to improve his current health and to decrease his risk of future health problems.  AGREE: Multiple dietary modification options and treatment options were discussed and Duard agreed to follow the recommendations documented in the above note.  ARRANGE: Awab was educated on the importance of frequent visits to treat obesity as outlined per CMS and USPSTF guidelines and agreed to schedule his next follow up appointment today.  Attestation Statements:   Reviewed by clinician on day of visit: allergies, medications, problem list, medical history, surgical history, family history, social history, and previous encounter notes.   I, Trixie Dredge, am acting as transcriptionist for Dennard Nip, MD.  I have reviewed the above documentation for accuracy and completeness, and I agree with the above. -  Dennard Nip, MD

## 2020-05-10 NOTE — Progress Notes (Signed)
Cardiology Office Note   Date:  05/11/2020   ID:  Brendan Cox, DOB Sep 03, 1956, MRN [id number]  PCP:  Koren Shiver, DO    No chief complaint on file.  CAD  Wt Readings from Last 3 Encounters:  05/11/20 202 lb 13.9 oz (92 kg)  04/22/20 202 lb (91.6 kg)  04/01/20 204 lb (92.5 kg)       History of Present Illness: Brendan Cox is a 64 y.o. male   who has had CAD, arthritis (bilateral hip replacements). He had a DES to the LAD in 7/09.Low risk stress test in 2016, prior to cholecystectomy.  He had lost weight through diet controlin the past.   Chronic back pain after a fall off of a ladder in 2015.  He retiredin 2017and his lifestyle has improved. He hadbothhip replacements done.   He took a PCSK9 inhibitor (Praluent) and felt muscle pain and weakness.     In May 2020, he noted having some burning in his chest and shortness of breath while working in the yard.  Chemical stress test was ordered at that time.  Results as follows: "Nuclear stress EF: 59%. The left ventricular ejection fraction is normal (55-65%).  There was no ST segment deviation noted during stress.  This is a low risk study. There is no evidence of ischemia or previous infarction The study is normal."  Since the last visit, he has felt well.  He did not get a COVID vaccine.  He has lost weight and is being seen at the [facility] wellness.  He is working outside and walks a lot.  He is being active outside.  10K steps 3 x/week.    Denies : Chest pain. Dizziness. Leg edema. Nitroglycerin use. Orthopnea. Palpitations. Paroxysmal nocturnal dyspnea. Shortness of breath. Syncope.    Past Medical History:  Diagnosis Date  . Arthritis   . Back pain   . Complication of anesthesia    reports that epidural did not work during left hip replacement  . Coronary artery disease   . Gallbladder problem   . GERD (gastroesophageal reflux disease)   . Hearing loss in left ear   . History of  heart attack   . Hyperlipidemia   . Hypertension   . Joint pain   . Multiple food allergies    Onions, foods in heavy cream  . Obesity   . OSA on CPAP 09/18/2014  . Rheumatoid arthritis (HCC)   . Spinal fracture of T12 vertebra (HCC) 10/19/2014   T12 burst fracture    Past Surgical History:  Procedure Laterality Date  . CARDIAC CATHETERIZATION    . CHOLECYSTECTOMY N/A 09/28/2015   Procedure: LAPAROSCOPIC CHOLECYSTECTOMY WITH INTRAOPERATIVE CHOLANGIOGRAM;  Surgeon: Claud Kelp, MD;  Location: [facility] OR;  Service: General;  Laterality: N/A;  . CORONARY ANGIOPLASTY     x 3  . TONSILLECTOMY     as a child  . TOTAL HIP ARTHROPLASTY     2008 on right, left 2015     Current Outpatient Medications  Medication Sig Dispense Refill  . amoxicillin (AMOXIL) 500 MG capsule TAKE 4 CAPSULES 1 HOUR PRIOR TO DENTAL PROCEDURE  2  . aspirin 81 MG tablet Take 81 mg by mouth daily.    . clopidogrel (PLAVIX) 75 MG tablet Take 1 tablet (75 mg total) by mouth daily. Please make follow up appt for further refills. [phone] 1st attempt 90 tablet 0  . co-enzyme Q-10 30 MG capsule Take 200 mg by mouth  daily.    . Fexofenadine HCl (ALLEGRA ALLERGY PO) Take 1 tablet by mouth daily.    . hydrochlorothiazide (HYDRODIURIL) 25 MG tablet Take 25 mg by mouth daily.    Marland Kitchen HYDROcodone-acetaminophen (NORCO/VICODIN) 5-325 MG per tablet Take 1 tablet by mouth as needed for moderate pain.    . meloxicam (MOBIC) 7.5 MG tablet Take 7.5 mg by mouth daily.    . metoprolol succinate (TOPROL-XL) 50 MG 24 hr tablet Take 50 mg by mouth daily. Take with or immediately following a meal.    . nitroGLYCERIN (NITROSTAT) 0.4 MG SL tablet Place 1 tablet (0.4 mg total) under the tongue every 5 (five) minutes as needed for chest pain. 25 tablet 3  . pantoprazole (PROTONIX) 20 MG tablet Take 20 mg by mouth daily.    Marland Kitchen tiZANidine (ZANAFLEX) 4 MG tablet Take 4 mg by mouth every 6 (six) hours as needed for muscle spasms.    . Vitamin D,  Ergocalciferol, (DRISDOL) 1.25 MG (50000 UNIT) CAPS capsule Take 1 capsule (50,000 Units total) by mouth every 7 (seven) days. 4 capsule 0  . zolpidem (AMBIEN) 10 MG tablet Take 10 mg by mouth at bedtime as needed for sleep.     No current facility-administered medications for this visit.    Allergies:   Diclofenac-misoprostol, Meperidine, Statins, Zetia [ezetimibe], Diclofenac-misoprostol, Other, and Praluent [alirocumab]    Social History:  The patient  reports that he has never smoked. He has never used smokeless tobacco. He reports current alcohol use of about 4.0 standard drinks of alcohol per week. He reports that he does not use drugs.   Family History:  The patient's family history includes Diabetes in his father; Healthy in his brother and brother; Heart disease in his father and mother; Hyperlipidemia in his father; Hypertension in his father and mother; Kidney failure in his father.    ROS:  Please see the history of present illness.   Otherwise, review of systems are positive for seasonal allergies.   All other systems are reviewed and negative.    PHYSICAL EXAM: VS:  BP (!) 146/82   Pulse 64   Ht 5\' 7"  (1.702 m)   Wt 202 lb 13.9 oz (92 kg)   SpO2 96%   BMI 31.77 kg/m  , BMI Body mass index is 31.77 kg/m. GEN: Well nourished, well developed, in no acute distress  HEENT: normal  Neck: no JVD, carotid bruits, or masses Cardiac: RRR; no murmurs, rubs, or gallops,no edema  Respiratory:  clear to auscultation bilaterally, normal work of breathing GI: soft, nontender, nondistended, + BS MS: no deformity or atrophy  Skin: warm and dry, no rash Neuro:  Strength and sensation are intact Psych: euthymic mood, full affect   EKG:   The ekg ordered 2/21 demonstrates NSR, no ST changes   Recent Labs: 01/28/2020: ALT 25; BUN 21; Creatinine, Ser 0.99; Hemoglobin 15.8; Platelets 276; Potassium 4.2; Sodium 140; TSH 0.956   Lipid Panel No results found for: CHOL, TRIG, HDL,  CHOLHDL, VLDL, LDLCALC, LDLDIRECT   Other studies Reviewed: Additional studies/ records that were reviewed today with results demonstrating: LDL 141.   ASSESSMENT AND PLAN:  1.   CAD: Low risk stress test in 2020.  Continue aggressive secondary prevention.  If anginal symptoms persist, would have low threshold for cardiac catheterization. 2.   Hypertension: Low-salt diet.  Well controlled at home. 120-130s systolic.   3.   Hyperlipidemia: He has been intolerant of statins.  He tried a PCSK9 inhibitor  and was intolerant.  Can check with Pharm.D.   regarding any possible other agents he could try.  He is hesitant to try anything.  He thinks he has chronic leg pais from the lipid lowering therapy.  He would only consider something that did not have a risk of muscle pains.  4.   Obesity: Whole food, plant-based diet recommended.  Improved of late; 1400-1700 calories/day.    Current medicines are reviewed at length with the patient today.  The patient concerns regarding his medicines were addressed.  The following changes have been made:  No change  Labs/ tests ordered today include:  No orders of the defined types were placed in this encounter.   Recommend 150 minutes/week of aerobic exercise Low fat, low carb, high fiber diet recommended  Disposition:   FU in 1 year   Signed, Larae Grooms, MD  05/11/2020 10:47 AM    [facility] [address], [place], [place]  [address] Phone: [phone]; Fax: [phone]

## 2020-05-11 ENCOUNTER — Ambulatory Visit (INDEPENDENT_AMBULATORY_CARE_PROVIDER_SITE_OTHER): Payer: Medicare Other | Admitting: Interventional Cardiology

## 2020-05-11 ENCOUNTER — Encounter: Payer: Self-pay | Admitting: Interventional Cardiology

## 2020-05-11 ENCOUNTER — Other Ambulatory Visit: Payer: Self-pay

## 2020-05-11 VITALS — BP 146/82 | HR 64 | Ht 67.0 in | Wt 202.9 lb

## 2020-05-11 DIAGNOSIS — I25119 Atherosclerotic heart disease of native coronary artery with unspecified angina pectoris: Secondary | ICD-10-CM | POA: Diagnosis not present

## 2020-05-11 DIAGNOSIS — I10 Essential (primary) hypertension: Secondary | ICD-10-CM

## 2020-05-11 DIAGNOSIS — E782 Mixed hyperlipidemia: Secondary | ICD-10-CM

## 2020-05-11 DIAGNOSIS — E669 Obesity, unspecified: Secondary | ICD-10-CM

## 2020-05-11 DIAGNOSIS — Z6832 Body mass index (BMI) 32.0-32.9, adult: Secondary | ICD-10-CM | POA: Diagnosis not present

## 2020-05-11 DIAGNOSIS — M5416 Radiculopathy, lumbar region: Secondary | ICD-10-CM | POA: Diagnosis not present

## 2020-05-11 NOTE — Patient Instructions (Signed)
Medication Instructions:  Your physician recommends that you continue on your current medications as directed. Please refer to the Current Medication list given to you today.  *If you need a refill on your cardiac medications before your next appointment, please call your pharmacy*   Lab Work: None ordered  If you have labs (blood work) drawn today and your tests are completely normal, you will receive your results only by: . MyChart Message (if you have MyChart) OR . A paper copy in the mail If you have any lab test that is abnormal or we need to change your treatment, we will call you to review the results.   Testing/Procedures: None ordered   Follow-Up: At [facility], you and your health needs are our priority.  As part of our continuing mission to provide you with exceptional heart care, we have created designated Provider Care Teams.  These Care Teams include your primary Cardiologist (physician) and Advanced Practice Providers (APPs -  Physician Assistants and Nurse Practitioners) who all work together to provide you with the care you need, when you need it.  We recommend signing up for the patient portal called "MyChart".  Sign up information is provided on this After Visit Summary.  MyChart is used to connect with patients for Virtual Visits (Telemedicine).  Patients are able to view lab/test results, encounter notes, upcoming appointments, etc.  Non-urgent messages can be sent to your provider as well.   To learn more about what you can do with MyChart, go to [email or web address].    Your next appointment:   12 month(s)  The format for your next appointment:   In Person  Provider:   You may see Jayadeep Varanasi, MD or one of the following Advanced Practice Providers on your designated Care Team:    Dayna Dunn, PA-C  Michele Lenze, PA-C    Other Instructions None  

## 2020-05-13 ENCOUNTER — Encounter (INDEPENDENT_AMBULATORY_CARE_PROVIDER_SITE_OTHER): Payer: Self-pay | Admitting: Family Medicine

## 2020-05-13 ENCOUNTER — Other Ambulatory Visit: Payer: Self-pay

## 2020-05-13 ENCOUNTER — Ambulatory Visit (INDEPENDENT_AMBULATORY_CARE_PROVIDER_SITE_OTHER): Payer: Medicare Other | Admitting: Family Medicine

## 2020-05-13 ENCOUNTER — Other Ambulatory Visit (INDEPENDENT_AMBULATORY_CARE_PROVIDER_SITE_OTHER): Payer: Self-pay | Admitting: Family Medicine

## 2020-05-13 VITALS — BP 113/69 | HR 62 | Temp 98.5°F | Ht 67.0 in | Wt 199.0 lb

## 2020-05-13 DIAGNOSIS — Z6831 Body mass index (BMI) 31.0-31.9, adult: Secondary | ICD-10-CM

## 2020-05-13 DIAGNOSIS — E669 Obesity, unspecified: Secondary | ICD-10-CM

## 2020-05-13 DIAGNOSIS — R7303 Prediabetes: Secondary | ICD-10-CM

## 2020-05-13 DIAGNOSIS — E66811 Obesity, class 1: Secondary | ICD-10-CM

## 2020-05-13 DIAGNOSIS — E7849 Other hyperlipidemia: Secondary | ICD-10-CM | POA: Diagnosis not present

## 2020-05-13 MED ORDER — METFORMIN HCL 500 MG PO TABS: 500.0000 mg | ORAL_TABLET | Freq: Every morning | ORAL | 0 refills | Status: DC

## 2020-05-13 NOTE — Progress Notes (Signed)
Chief Complaint:   OBESITY Brendan Cox is here to discuss his progress with his obesity treatment plan along with follow-up of his obesity related diagnoses. Brendan Cox is on keeping a food journal and adhering to recommended goals of 1200-1700 calories and 85+ grams of protein daily and states he is following his eating plan approximately 90-95% of the time. Brendan Cox states he is walking 7,000-12,000 steps, and yard work 7 times per week.  Today's visit was #: 7 Starting weight: 215 lbs Starting date: 01/28/2020 Today's weight: 199 lbs Today's date: 05/13/2020 Total lbs lost to date: 16 Total lbs lost since last in-office visit: 3  Interim History: Brendan Cox continues to do well with weight loss. He has indulged a bit at times, but she is still doing well with journaling and being mindful. He notes some PM polyphagia at times.  Subjective:   1. Pre-diabetes Brendan Cox notes increased polyphagia especially in the evenings. He has done well with diet, exercise, and weight loss. His visceral fat has been improving slowly.    2. Other hyperlipidemia Brendan Cox cannot tolerate statins, and he is doing well with diet and weight loss.  Assessment/Plan:   1. Pre-diabetes Brendan Cox will continue to work on weight loss, diet, exercise, and decreasing simple carbohydrates to help decrease the risk of diabetes. Brendan Cox agreed to start metformin 500 mg q daily with no refills. We will recheck labs in 1-2 months.  - metFORMIN (GLUCOPHAGE) 500 MG tablet; Take 1 tablet (500 mg total) by mouth every morning.  Dispense: 30 tablet; Refill: 0  2. Other hyperlipidemia Cardiovascular risk and specific lipid/LDL goals reviewed. We discussed several lifestyle modifications today and Brendan Cox will continue to work on diet, exercise and weight loss efforts. We will recheck labs in 1-2 months. Orders and follow up as documented in patient record.   3. Class 1 obesity with serious comorbidity and body mass index (BMI) of 31.0 to  31.9 in adult, unspecified obesity type Brendan Cox is currently in the action stage of change. As such, his goal is to continue with weight loss efforts. He has agreed to keeping a food journal and adhering to recommended goals of 1200 calories and 70+ grams of protein daily.   Exercise goals: As is.  Behavioral modification strategies: celebration eating strategies.  Brendan Cox has agreed to follow-up with our clinic in 4 weeks. He was informed of the importance of frequent follow-up visits to maximize his success with intensive lifestyle modifications for his multiple health conditions.   Objective:   Blood pressure 113/69, pulse 62, temperature 98.5 F (36.9 C), temperature source Oral, height 5\' 7"  (1.702 m), weight 199 lb (90.3 kg), SpO2 97 %. Body mass index is 31.17 kg/m.  General: Cooperative, alert, well developed, in no acute distress. HEENT: Conjunctivae and lids unremarkable. Cardiovascular: Regular rhythm.  Lungs: Normal work of breathing. Neurologic: No focal deficits.   Lab Results  Component Value Date   CREATININE 0.99 01/28/2020   BUN 21 01/28/2020   NA 140 01/28/2020   K 4.2 01/28/2020   CL 105 01/28/2020   CO2 23 01/28/2020   Lab Results  Component Value Date   ALT 25 01/28/2020   AST 24 01/28/2020   ALKPHOS 79 01/28/2020   BILITOT 0.6 01/28/2020   Lab Results  Component Value Date   HGBA1C 5.6 01/28/2020   Lab Results  Component Value Date   INSULIN 7.8 01/28/2020   Lab Results  Component Value Date   TSH 0.956 01/28/2020   No results  found for: CHOL, HDL, LDLCALC, LDLDIRECT, TRIG, CHOLHDL Lab Results  Component Value Date   WBC 6.5 01/28/2020   HGB 15.8 01/28/2020   HCT 46.5 01/28/2020   MCV 86 01/28/2020   PLT 276 01/28/2020   No results found for: IRON, TIBC, FERRITIN  Obesity Behavioral Intervention Documentation for Insurance:   Approximately 15 minutes were spent on the discussion below.  ASK: We discussed the diagnosis of obesity  with Brendan Cox today and Brendan Cox agreed to give [place] permission to discuss obesity behavioral modification therapy today.  ASSESS: Brendan Cox has the diagnosis of obesity and his BMI today is 31.16. Brendan Cox is in the action stage of change.   ADVISE: Brendan Cox was educated on the multiple health risks of obesity as well as the benefit of weight loss to improve his health. He was advised of the need for long term treatment and the importance of lifestyle modifications to improve his current health and to decrease his risk of future health problems.  AGREE: Multiple dietary modification options and treatment options were discussed and Brendan Cox agreed to follow the recommendations documented in the above note.  ARRANGE: Brendan Cox was educated on the importance of frequent visits to treat obesity as outlined per CMS and USPSTF guidelines and agreed to schedule his next follow up appointment today.  Attestation Statements:   Reviewed by clinician on day of visit: allergies, medications, problem list, medical history, surgical history, family history, social history, and previous encounter notes.   I, Burt Knack, am acting as transcriptionist for Quillian Quince, MD.  I have reviewed the above documentation for accuracy and completeness, and I agree with the above. -  Quillian Quince, MD

## 2020-06-10 ENCOUNTER — Encounter (INDEPENDENT_AMBULATORY_CARE_PROVIDER_SITE_OTHER): Payer: Self-pay | Admitting: Family Medicine

## 2020-06-10 ENCOUNTER — Other Ambulatory Visit: Payer: Self-pay

## 2020-06-10 ENCOUNTER — Ambulatory Visit (INDEPENDENT_AMBULATORY_CARE_PROVIDER_SITE_OTHER): Payer: Medicare Other | Admitting: Family Medicine

## 2020-06-10 VITALS — BP 137/68 | HR 58 | Temp 97.8°F | Ht 67.0 in | Wt 195.0 lb

## 2020-06-10 DIAGNOSIS — E559 Vitamin D deficiency, unspecified: Secondary | ICD-10-CM

## 2020-06-10 DIAGNOSIS — R7303 Prediabetes: Secondary | ICD-10-CM | POA: Diagnosis not present

## 2020-06-10 DIAGNOSIS — Z6830 Body mass index (BMI) 30.0-30.9, adult: Secondary | ICD-10-CM | POA: Diagnosis not present

## 2020-06-10 DIAGNOSIS — E7849 Other hyperlipidemia: Secondary | ICD-10-CM

## 2020-06-10 DIAGNOSIS — E669 Obesity, unspecified: Secondary | ICD-10-CM

## 2020-06-11 NOTE — Progress Notes (Signed)
Chief Complaint:   OBESITY Brendan Cox is here to discuss his progress with his obesity treatment plan along with follow-up of his obesity related diagnoses. Brendan Cox is on keeping a food journal and adhering to recommended goals of 1200 calories and 70+ grams of protein daily and states he is following his eating plan approximately 90% of the time. Brendan Cox states he is walking 9,000 steps 7 times per week.  Today's visit was #: 8 Starting weight: 215 lbs Starting date: 01/28/2020 Today's weight: 195 lbs Today's date: 06/10/2020 Total lbs lost to date: 20 Total lbs lost since last in-office visit: 4  Interim History: Brendan Cox continues to do well with weight loss. He has indulged occasionally but he has done well getting back on track. He has done well increasing lean protein and his hunger is mostly controlled.  Subjective:   1. Pre-diabetes Brendan Cox was prescribed metformin but he declined to take it as he is doing well so far without it.  2. Vitamin D deficiency Brendan Cox is stable on Vit D, and he is due to have labs checked soon.  3. Other hyperlipidemia Brendan Cox is working on diet and exercise, and he is due to have labs checked soon.  Assessment/Plan:   1. Pre-diabetes Brendan Cox will continue to work on weight loss, exercise, and decreasing simple carbohydrates to help decrease the risk of diabetes. Brendan Cox is ok to discontinue metformin for now. We will recheck labs before his next visit.  - Comprehensive metabolic panel - CBC with Differential/Platelet - Hemoglobin A1c - Insulin, random  2. Vitamin D deficiency Low Vitamin D level contributes to fatigue and are associated with obesity, breast, and colon cancer. We will recheck labs before his next visit. Brendan Cox will follow-up for routine testing of Vitamin D, at least 2-3 times per year to avoid over-replacement.  - VITAMIN D 25 Hydroxy (Vit-D Deficiency, Fractures)  3. Other hyperlipidemia Cardiovascular risk and specific  lipid/LDL goals reviewed. We discussed several lifestyle modifications today. Brendan Cox will continue to work on diet, exercise and weight loss efforts. We will recheck labs before his next visit. Orders and follow up as documented in patient record.   Counseling Intensive lifestyle modifications are the first line treatment for this issue. . Dietary changes: Increase soluble fiber. Decrease simple carbohydrates. . Exercise changes: Moderate to vigorous-intensity aerobic activity 150 minutes per week if tolerated. . Lipid-lowering medications: see documented in medical record.  - Comprehensive metabolic panel - CBC with Differential/Platelet - Lipid Panel With LDL/HDL Ratio  4. Class 1 obesity with serious comorbidity and body mass index (BMI) of 30.0 to 30.9 in adult, unspecified obesity type Brendan Cox is currently in the action stage of change. As such, his goal is to continue with weight loss efforts. He has agreed to keeping a food journal and adhering to recommended goals of 1500-1750 calories and 85+ grams of protein daily.   Exercise goals: As is.  Behavioral modification strategies: meal planning and cooking strategies.  Brendan Cox has agreed to follow-up with our clinic in 4 weeks. He was informed of the importance of frequent follow-up visits to maximize his success with intensive lifestyle modifications for his multiple health conditions.   Brendan Cox was informed we would discuss his lab results at his next visit unless there is a critical issue that needs to be addressed sooner. Brendan Cox agreed to keep his next visit at the agreed upon time to discuss these results.  Objective:   Blood pressure 137/68, pulse (!) 58, temperature 97.8 F (36.6  C), temperature source Oral, height 5\' 7"  (1.702 m), weight 195 lb (88.5 kg), SpO2 99 %. Body mass index is 30.54 kg/m.  General: Cooperative, alert, well developed, in no acute distress. HEENT: Conjunctivae and lids unremarkable. Cardiovascular:  Regular rhythm.  Lungs: Normal work of breathing. Neurologic: No focal deficits.   Lab Results  Component Value Date   CREATININE 0.99 01/28/2020   BUN 21 01/28/2020   NA 140 01/28/2020   K 4.2 01/28/2020   CL 105 01/28/2020   CO2 23 01/28/2020   Lab Results  Component Value Date   ALT 25 01/28/2020   AST 24 01/28/2020   ALKPHOS 79 01/28/2020   BILITOT 0.6 01/28/2020   Lab Results  Component Value Date   HGBA1C 5.6 01/28/2020   Lab Results  Component Value Date   INSULIN 7.8 01/28/2020   Lab Results  Component Value Date   TSH 0.956 01/28/2020   No results found for: CHOL, HDL, LDLCALC, LDLDIRECT, TRIG, CHOLHDL Lab Results  Component Value Date   WBC 6.5 01/28/2020   HGB 15.8 01/28/2020   HCT 46.5 01/28/2020   MCV 86 01/28/2020   PLT 276 01/28/2020   No results found for: IRON, TIBC, FERRITIN  Obesity Behavioral Intervention Documentation for Insurance:   Approximately 15 minutes were spent on the discussion below.  ASK: We discussed the diagnosis of obesity with Brendan Cox today and Brendan Cox agreed to give [place] permission to discuss obesity behavioral modification therapy today.  ASSESS: Brendan Cox has the diagnosis of obesity and his BMI today is 30.53. Brendan Cox is in the action stage of change.   ADVISE: Brendan Cox was educated on the multiple health risks of obesity as well as the benefit of weight loss to improve his health. He was advised of the need for long term treatment and the importance of lifestyle modifications to improve his current health and to decrease his risk of future health problems.  AGREE: Multiple dietary modification options and treatment options were discussed and Brendan Cox agreed to follow the recommendations documented in the above note.  ARRANGE: Brendan Cox was educated on the importance of frequent visits to treat obesity as outlined per CMS and USPSTF guidelines and agreed to schedule his next follow up appointment today.  Attestation Statements:     Reviewed by clinician on day of visit: allergies, medications, problem list, medical history, surgical history, family history, social history, and previous encounter notes.   I, Trixie Dredge, am acting as transcriptionist for Dennard Nip, MD.  I have reviewed the above documentation for accuracy and completeness, and I agree with the above. -  Dennard Nip, MD

## 2020-06-20 ENCOUNTER — Other Ambulatory Visit (INDEPENDENT_AMBULATORY_CARE_PROVIDER_SITE_OTHER): Payer: Self-pay | Admitting: Family Medicine

## 2020-06-20 DIAGNOSIS — R7303 Prediabetes: Secondary | ICD-10-CM

## 2020-06-30 DIAGNOSIS — G4700 Insomnia, unspecified: Secondary | ICD-10-CM | POA: Diagnosis not present

## 2020-06-30 DIAGNOSIS — G8929 Other chronic pain: Secondary | ICD-10-CM | POA: Diagnosis not present

## 2020-07-01 ENCOUNTER — Other Ambulatory Visit: Payer: Self-pay | Admitting: Family Medicine

## 2020-07-01 DIAGNOSIS — G8929 Other chronic pain: Secondary | ICD-10-CM

## 2020-07-01 DIAGNOSIS — M6281 Muscle weakness (generalized): Secondary | ICD-10-CM

## 2020-07-07 DIAGNOSIS — R7303 Prediabetes: Secondary | ICD-10-CM | POA: Diagnosis not present

## 2020-07-07 DIAGNOSIS — E7849 Other hyperlipidemia: Secondary | ICD-10-CM | POA: Diagnosis not present

## 2020-07-07 DIAGNOSIS — E559 Vitamin D deficiency, unspecified: Secondary | ICD-10-CM | POA: Diagnosis not present

## 2020-07-08 LAB — COMPREHENSIVE METABOLIC PANEL
ALT: 22 IU/L (ref 0–44)
AST: 24 IU/L (ref 0–40)
Albumin/Globulin Ratio: 1.9 (ref 1.2–2.2)
Albumin: 4.2 g/dL (ref 3.8–4.8)
Alkaline Phosphatase: 76 IU/L (ref 48–121)
BUN/Creatinine Ratio: 26 — ABNORMAL HIGH (ref 10–24)
BUN: 23 mg/dL (ref 8–27)
Bilirubin Total: 0.6 mg/dL (ref 0.0–1.2)
CO2: 24 mmol/L (ref 20–29)
Calcium: 9 mg/dL (ref 8.6–10.2)
Chloride: 103 mmol/L (ref 96–106)
Creatinine, Ser: 0.9 mg/dL (ref 0.76–1.27)
GFR calc Af Amer: 103 mL/min/{1.73_m2} (ref >59)
GFR calc non Af Amer: 89 mL/min/{1.73_m2} (ref >59)
Globulin, Total: 2.2 g/dL (ref 1.5–4.5)
Glucose: 101 mg/dL — ABNORMAL HIGH (ref 65–99)
Potassium: 3.8 mmol/L (ref 3.5–5.2)
Sodium: 140 mmol/L (ref 134–144)
Total Protein: 6.4 g/dL (ref 6.0–8.5)

## 2020-07-08 LAB — HEMOGLOBIN A1C
Est. average glucose Bld gHb Est-mCnc: 111 mg/dL
Hgb A1c MFr Bld: 5.5 % (ref 4.8–5.6)

## 2020-07-08 LAB — LIPID PANEL WITH LDL/HDL RATIO
Cholesterol, Total: 245 mg/dL — ABNORMAL HIGH (ref 100–199)
HDL: 44 mg/dL (ref >39)
LDL Chol Calc (NIH): 184 mg/dL — ABNORMAL HIGH (ref 0–99)
LDL/HDL Ratio: 4.2 ratio — ABNORMAL HIGH (ref 0.0–3.6)
Triglycerides: 97 mg/dL (ref 0–149)
VLDL Cholesterol Cal: 17 mg/dL (ref 5–40)

## 2020-07-08 LAB — CBC WITH DIFFERENTIAL/PLATELET
Basophils Absolute: 0.1 10*3/uL (ref 0.0–0.2)
Basos: 1 %
EOS (ABSOLUTE): 0.3 10*3/uL (ref 0.0–0.4)
Eos: 6 %
Hematocrit: 45.8 % (ref 37.5–51.0)
Hemoglobin: 14.9 g/dL (ref 13.0–17.7)
Immature Grans (Abs): 0 10*3/uL (ref 0.0–0.1)
Immature Granulocytes: 0 %
Lymphocytes Absolute: 1.9 10*3/uL (ref 0.7–3.1)
Lymphs: 31 %
MCH: 29.1 pg (ref 26.6–33.0)
MCHC: 32.5 g/dL (ref 31.5–35.7)
MCV: 90 fL (ref 79–97)
Monocytes Absolute: 0.5 10*3/uL (ref 0.1–0.9)
Monocytes: 8 %
Neutrophils Absolute: 3.3 10*3/uL (ref 1.4–7.0)
Neutrophils: 54 %
Platelets: 269 10*3/uL (ref 150–450)
RBC: 5.12 x10E6/uL (ref 4.14–5.80)
RDW: 13.2 % (ref 11.6–15.4)
WBC: 6.2 10*3/uL (ref 3.4–10.8)

## 2020-07-08 LAB — VITAMIN D 25 HYDROXY (VIT D DEFICIENCY, FRACTURES): Vit D, 25-Hydroxy: 38 ng/mL (ref 30.0–100.0)

## 2020-07-08 LAB — INSULIN, RANDOM: INSULIN: 7.8 u[IU]/mL (ref 2.6–24.9)

## 2020-07-09 ENCOUNTER — Encounter (INDEPENDENT_AMBULATORY_CARE_PROVIDER_SITE_OTHER): Payer: Self-pay | Admitting: Family Medicine

## 2020-07-09 ENCOUNTER — Ambulatory Visit (INDEPENDENT_AMBULATORY_CARE_PROVIDER_SITE_OTHER): Payer: Medicare Other | Admitting: Family Medicine

## 2020-07-09 ENCOUNTER — Other Ambulatory Visit: Payer: Self-pay

## 2020-07-09 VITALS — BP 132/80 | HR 55 | Temp 98.0°F | Ht 67.0 in | Wt 192.0 lb

## 2020-07-09 DIAGNOSIS — E559 Vitamin D deficiency, unspecified: Secondary | ICD-10-CM

## 2020-07-09 DIAGNOSIS — Z6830 Body mass index (BMI) 30.0-30.9, adult: Secondary | ICD-10-CM | POA: Diagnosis not present

## 2020-07-09 DIAGNOSIS — E669 Obesity, unspecified: Secondary | ICD-10-CM

## 2020-07-09 DIAGNOSIS — E7849 Other hyperlipidemia: Secondary | ICD-10-CM | POA: Diagnosis not present

## 2020-07-09 MED ORDER — VITAMIN D (ERGOCALCIFEROL) 1.25 MG (50000 UNIT) PO CAPS: 50000.0000 [IU] | ORAL_CAPSULE | ORAL | 1 refills | Status: DC

## 2020-07-13 NOTE — Progress Notes (Signed)
Chief Complaint:   OBESITY Brendan Cox is here to discuss his progress with his obesity treatment plan along with follow-up of his obesity related diagnoses. Brendan Cox is on keeping a food journal and adhering to recommended goals of 1500-1750 calories and 85+ grams of protein daily and states he is following his eating plan approximately 80% of the time. Brendan Cox states he is doing yard work, and walking 8,000-9,000 steps 7 times per week.  Today's visit was #: 9 Starting weight: 215 lbs Starting date: 01/28/2020 Today's weight: 192 lbs Today's date: 07/09/2020 Total lbs lost to date: 23 Total lbs lost since last in-office visit: 3  Interim History: Brendan Cox continues to do well with weight loss and is working on increasing protein. His hunger is mostly controlled and he is doing well with activity.  Subjective:   1. Vitamin D deficiency [place] is stable on Vit D, and his level is improving but not yet at goal. I discussed labs with the patient today.  2. Other hyperlipidemia Brendan Cox last LDL was elevated. He is intolerant of statins, and he is working on diet. He denies chest pain.  Assessment/Plan:   1. Vitamin D deficiency Low Vitamin D level contributes to fatigue and are associated with obesity, breast, and colon cancer. We will refill prescription Vitamin D for 2 months with no refills. Brendan Cox will follow-up for routine testing of Vitamin D, at least 2-3 times per year to avoid over-replacement.  - Vitamin D, Ergocalciferol, (DRISDOL) 1.25 MG (50000 UNIT) CAPS capsule; Take 1 capsule (50,000 Units total) by mouth every 7 (seven) days.  Dispense: 4 capsule; Refill: 1  2. Other hyperlipidemia Cardiovascular risk and specific lipid/LDL goals reviewed. We discussed several lifestyle modifications today. Brendan Cox will continue to work on diet, exercise and weight loss efforts. Orders and follow up as documented in patient record.   3. Class 1 obesity with serious comorbidity and body mass  index (BMI) of 30.0 to 30.9 in adult, unspecified obesity type Brendan Cox is currently in the action stage of change. As such, his goal is to continue with weight loss efforts. He has agreed to keeping a food journal and adhering to recommended goals of 1500-1750 calories and 85+ grams of protein daily.   Exercise goals: Brendan Cox is to start on core strengthening.  Behavioral modification strategies: increasing lean protein intake and increasing water intake.  Brendan Cox has agreed to follow-up with our clinic in 3 to 4 weeks. He was informed of the importance of frequent follow-up visits to maximize his success with intensive lifestyle modifications for his multiple health conditions.   Objective:   Blood pressure (!) 132/80, pulse 55, temperature 98 F (36.7 C), temperature source Oral, height 5\' 7"  (1.702 m), weight 192 lb (87.1 kg), SpO2 94 %. Body mass index is 30.07 kg/m.  General: Cooperative, alert, well developed, in no acute distress. HEENT: Conjunctivae and lids unremarkable. Cardiovascular: Regular rhythm.  Lungs: Normal work of breathing. Neurologic: No focal deficits.   Lab Results  Component Value Date   CREATININE 0.90 07/07/2020   BUN 23 07/07/2020   NA 140 07/07/2020   K 3.8 07/07/2020   CL 103 07/07/2020   CO2 24 07/07/2020   Lab Results  Component Value Date   ALT 22 07/07/2020   AST 24 07/07/2020   ALKPHOS 76 07/07/2020   BILITOT 0.6 07/07/2020   Lab Results  Component Value Date   HGBA1C 5.5 07/07/2020   HGBA1C 5.6 01/28/2020   Lab Results  Component Value  Date   INSULIN 7.8 07/07/2020   INSULIN 7.8 01/28/2020   Lab Results  Component Value Date   TSH 0.956 01/28/2020   Lab Results  Component Value Date   CHOL 245 (H) 07/07/2020   HDL 44 07/07/2020   LDLCALC 184 (H) 07/07/2020   TRIG 97 07/07/2020   Lab Results  Component Value Date   WBC 6.2 07/07/2020   HGB 14.9 07/07/2020   HCT 45.8 07/07/2020   MCV 90 07/07/2020   PLT 269 07/07/2020    No results found for: IRON, TIBC, FERRITIN  Obesity Behavioral Intervention Documentation for Insurance:   Approximately 15 minutes were spent on the discussion below.  ASK: We discussed the diagnosis of obesity with Brendan Cox today and Brendan Cox agreed to give [place] permission to discuss obesity behavioral modification therapy today.  ASSESS: Brendan Cox has the diagnosis of obesity and his BMI today is 30.06. Brendan Cox is in the action stage of change.   ADVISE: Brendan Cox was educated on the multiple health risks of obesity as well as the benefit of weight loss to improve his health. He was advised of the need for long term treatment and the importance of lifestyle modifications to improve his current health and to decrease his risk of future health problems.  AGREE: Multiple dietary modification options and treatment options were discussed and Brendan Cox agreed to follow the recommendations documented in the above note.  ARRANGE: Brendan Cox was educated on the importance of frequent visits to treat obesity as outlined per CMS and USPSTF guidelines and agreed to schedule his next follow up appointment today.  Attestation Statements:   Reviewed by clinician on day of visit: allergies, medications, problem list, medical history, surgical history, family history, social history, and previous encounter notes.   I, Burt Knack, am acting as transcriptionist for Quillian Quince, MD.  I have reviewed the above documentation for accuracy and completeness, and I agree with the above. -  Quillian Quince, MD

## 2020-07-14 ENCOUNTER — Other Ambulatory Visit: Payer: Self-pay | Admitting: Interventional Cardiology

## 2020-08-03 ENCOUNTER — Ambulatory Visit
Admission: RE | Admit: 2020-08-03 | Discharge: 2020-08-03 | Disposition: A | Discharge status: Home / Self Care | Payer: Medicare Other | Source: Ambulatory Visit | Attending: Family Medicine | Admitting: Family Medicine

## 2020-08-03 ENCOUNTER — Other Ambulatory Visit: Payer: Self-pay

## 2020-08-03 DIAGNOSIS — G8929 Other chronic pain: Secondary | ICD-10-CM

## 2020-08-03 DIAGNOSIS — M48061 Spinal stenosis, lumbar region without neurogenic claudication: Secondary | ICD-10-CM | POA: Diagnosis not present

## 2020-08-03 DIAGNOSIS — M6281 Muscle weakness (generalized): Secondary | ICD-10-CM

## 2020-08-12 ENCOUNTER — Ambulatory Visit (INDEPENDENT_AMBULATORY_CARE_PROVIDER_SITE_OTHER): Payer: Medicare Other | Admitting: Family Medicine

## 2020-08-12 ENCOUNTER — Encounter (INDEPENDENT_AMBULATORY_CARE_PROVIDER_SITE_OTHER): Payer: Self-pay | Admitting: Family Medicine

## 2020-08-12 ENCOUNTER — Other Ambulatory Visit: Payer: Self-pay

## 2020-08-12 VITALS — BP 126/75 | HR 61 | Temp 98.2°F | Ht 67.0 in | Wt 190.0 lb

## 2020-08-12 DIAGNOSIS — Z6830 Body mass index (BMI) 30.0-30.9, adult: Secondary | ICD-10-CM

## 2020-08-12 DIAGNOSIS — E559 Vitamin D deficiency, unspecified: Secondary | ICD-10-CM | POA: Diagnosis not present

## 2020-08-12 DIAGNOSIS — E669 Obesity, unspecified: Secondary | ICD-10-CM

## 2020-08-12 MED ORDER — VITAMIN D (ERGOCALCIFEROL) 1.25 MG (50000 UNIT) PO CAPS: 50000.0000 [IU] | ORAL_CAPSULE | ORAL | 0 refills | Status: DC

## 2020-08-12 NOTE — Progress Notes (Signed)
Chief Complaint:   OBESITY Brendan Cox is here to discuss his progress with his obesity treatment plan along with follow-up of his obesity related diagnoses. Brendan Cox is on keeping a food journal and adhering to recommended goals of 1500-1700 calories and 85+ grams of protein daily and states he is following his eating plan approximately 70% of the time. Brendan Cox states he is doing 0 minutes 0 times per week.  Today's visit was #: 10 Starting weight: 215 lbs Starting date: 01/28/2020 Today's weight: 190 lbs Today's date: 08/12/2020 Total lbs lost to date: 25 Total lbs lost since last in-office visit: 2  Interim History: Brendan Cox continues to do well with weight loss. He hasn't journaled as much but he is still mindful of his portions and food choices.  Subjective:   1. Vitamin D deficiency Brendan Cox is stable on Vit D, and he denies nausea, vomiting, or muscle weakness.  Assessment/Plan:   1. Vitamin D deficiency Low Vitamin D level contributes to fatigue and are associated with obesity, breast, and colon cancer. We will refill prescription Vitamin D for 1 month. Brendan Cox will follow-up for routine testing of Vitamin D, at least 2-3 times per year to avoid over-replacement. We will recheck labs in 1-2 months.  - Vitamin D, Ergocalciferol, (DRISDOL) 1.25 MG (50000 UNIT) CAPS capsule; Take 1 capsule (50,000 Units total) by mouth every 7 (seven) days.  Dispense: 4 capsule; Refill: 0  2. Class 1 obesity with serious comorbidity and body mass index (BMI) of 30.0 to 30.9 in adult, unspecified obesity type Brendan Cox is currently in the action stage of change. As such, his goal is to continue with weight loss efforts. He has agreed to keeping a food journal and adhering to recommended goals of 1500-1750 calories and 85+ grams of protein daily or practicing portion control and making smarter food choices, such as increasing vegetables and decreasing simple carbohydrates.   Behavioral modification strategies:  increasing lean protein intake and keeping healthy foods in the home.  Brendan Cox has agreed to follow-up with our clinic in 4 weeks. He was informed of the importance of frequent follow-up visits to maximize his success with intensive lifestyle modifications for his multiple health conditions.   Objective:   Blood pressure 126/75, pulse 61, temperature 98.2 F (36.8 C), temperature source Oral, height 5\' 7"  (1.702 m), weight 190 lb (86.2 kg), SpO2 97 %. Body mass index is 29.76 kg/m.  General: Cooperative, alert, well developed, in no acute distress. HEENT: Conjunctivae and lids unremarkable. Cardiovascular: Regular rhythm.  Lungs: Normal work of breathing. Neurologic: No focal deficits.   Lab Results  Component Value Date   CREATININE 0.90 07/07/2020   BUN 23 07/07/2020   NA 140 07/07/2020   K 3.8 07/07/2020   CL 103 07/07/2020   CO2 24 07/07/2020   Lab Results  Component Value Date   ALT 22 07/07/2020   AST 24 07/07/2020   ALKPHOS 76 07/07/2020   BILITOT 0.6 07/07/2020   Lab Results  Component Value Date   HGBA1C 5.5 07/07/2020   HGBA1C 5.6 01/28/2020   Lab Results  Component Value Date   INSULIN 7.8 07/07/2020   INSULIN 7.8 01/28/2020   Lab Results  Component Value Date   TSH 0.956 01/28/2020   Lab Results  Component Value Date   CHOL 245 (H) 07/07/2020   HDL 44 07/07/2020   LDLCALC 184 (H) 07/07/2020   TRIG 97 07/07/2020   Lab Results  Component Value Date   WBC 6.2 07/07/2020  HGB 14.9 07/07/2020   HCT 45.8 07/07/2020   MCV 90 07/07/2020   PLT 269 07/07/2020   No results found for: IRON, TIBC, FERRITIN  Obesity Behavioral Intervention Documentation for Insurance:   Approximately 15 minutes were spent on the discussion below.  ASK: We discussed the diagnosis of obesity with Brendan Cox today and Brendan Cox agreed to give Brendan Cox permission to discuss obesity behavioral modification therapy today.  ASSESS: Brendan Cox has the diagnosis of obesity and his BMI  today is 29.75. Brendan Cox is in the action stage of change.   ADVISE: Brendan Cox was educated on the multiple health risks of obesity as well as the benefit of weight loss to improve his health. He was advised of the need for long term treatment and the importance of lifestyle modifications to improve his current health and to decrease his risk of future health problems.  AGREE: Multiple dietary modification options and treatment options were discussed and Brendan Cox agreed to follow the recommendations documented in the above note.  ARRANGE: Brendan Cox was educated on the importance of frequent visits to treat obesity as outlined per CMS and USPSTF guidelines and agreed to schedule his next follow up appointment today.  Attestation Statements:   Reviewed by clinician on day of visit: allergies, medications, problem list, medical history, surgical history, family history, social history, and previous encounter notes.   I, Burt Knack, am acting as transcriptionist for Quillian Quince, MD.  I have reviewed the above documentation for accuracy and completeness, and I agree with the above. -  Quillian Quince, MD

## 2020-08-14 DIAGNOSIS — M5416 Radiculopathy, lumbar region: Secondary | ICD-10-CM | POA: Diagnosis not present

## 2020-09-07 ENCOUNTER — Other Ambulatory Visit (INDEPENDENT_AMBULATORY_CARE_PROVIDER_SITE_OTHER): Payer: Self-pay | Admitting: Family Medicine

## 2020-09-07 DIAGNOSIS — E559 Vitamin D deficiency, unspecified: Secondary | ICD-10-CM

## 2020-09-08 DIAGNOSIS — M5136 Other intervertebral disc degeneration, lumbar region: Secondary | ICD-10-CM | POA: Diagnosis not present

## 2020-09-09 ENCOUNTER — Other Ambulatory Visit: Payer: Self-pay

## 2020-09-09 ENCOUNTER — Encounter (INDEPENDENT_AMBULATORY_CARE_PROVIDER_SITE_OTHER): Payer: Self-pay | Admitting: Family Medicine

## 2020-09-09 ENCOUNTER — Ambulatory Visit (INDEPENDENT_AMBULATORY_CARE_PROVIDER_SITE_OTHER): Payer: Medicare Other | Admitting: Family Medicine

## 2020-09-09 VITALS — BP 116/69 | HR 60 | Temp 97.8°F | Ht 67.0 in | Wt 191.0 lb

## 2020-09-09 DIAGNOSIS — E669 Obesity, unspecified: Secondary | ICD-10-CM

## 2020-09-09 DIAGNOSIS — E559 Vitamin D deficiency, unspecified: Secondary | ICD-10-CM | POA: Diagnosis not present

## 2020-09-09 DIAGNOSIS — I10 Essential (primary) hypertension: Secondary | ICD-10-CM | POA: Diagnosis not present

## 2020-09-09 DIAGNOSIS — Z6830 Body mass index (BMI) 30.0-30.9, adult: Secondary | ICD-10-CM

## 2020-09-09 MED ORDER — VITAMIN D (ERGOCALCIFEROL) 1.25 MG (50000 UNIT) PO CAPS: 50000.0000 [IU] | ORAL_CAPSULE | ORAL | 0 refills | Status: DC

## 2020-09-10 NOTE — Progress Notes (Signed)
Chief Complaint:   OBESITY Brendan Cox is here to discuss his progress with his obesity treatment plan along with follow-up of his obesity related diagnoses. Brendan Cox is on keeping a food journal and adhering to recommended goals of 1500-1750 calories and 85+ grams of protein daily and states he is following his eating plan approximately 50% of the time. Brendan Cox states he is doing 0 minutes 0 times per week.  Today's visit was #: 11 Starting weight: 215 lbs Starting date: 01/28/2020 Today's weight: 191 lbs Today's date: 09/09/2020 Total lbs lost to date: 24 Total lbs lost since last in-office visit: 0  Interim History: Brendan Cox has struggled more with stress eating. He notes cereal in the morning increases his hunger, and he is working on increasing protein especially in the morning. His exercise has been limited by back pain but he had a steroid epidural yesterday so hopefully this will improve.  Subjective:   1. Vitamin D deficiency Brendan Cox is stable on Vit D, but his level is not yet at goal.  2. Essential hypertension Brendan Cox's blood pressure is well controlled on his medications and with diet and weight loss. He denies chest pain or lightheaded feelings.  Assessment/Plan:   1. Vitamin D deficiency Low Vitamin D level contributes to fatigue and are associated with obesity, breast, and colon cancer. We will refill prescription Vitamin D for 1 month, and will continue to monitor. Brendan Cox will follow-up for routine testing of Vitamin D, at least 2-3 times per year to avoid over-replacement.  - Vitamin D, Ergocalciferol, (DRISDOL) 1.25 MG (50000 UNIT) CAPS capsule; Take 1 capsule (50,000 Units total) by mouth every 7 (seven) days.  Dispense: 4 capsule; Refill: 0  2. Essential hypertension Brendan Cox will continue diet, exercise, hydration, and healthy weight loss to improve blood pressure control. We will watch for signs of hypotension as he continues his lifestyle modifications.  3. Class 1  obesity with serious comorbidity and body mass index (BMI) of 30.0 to 30.9 in adult, unspecified obesity type Brendan Cox is currently in the action stage of change. As such, his goal is to continue with weight loss efforts. He has agreed to keeping a food journal and adhering to recommended goals of 1500-1700 calories and 85+ grams of protein daily.   Behavioral modification strategies: increasing lean protein intake.  Brendan Cox has agreed to follow-up with our clinic in 4 weeks. He was informed of the importance of frequent follow-up visits to maximize his success with intensive lifestyle modifications for his multiple health conditions.   Objective:   Blood pressure 116/69, pulse 60, temperature 97.8 F (36.6 C), height 5\' 7"  (1.702 m), weight 191 lb (86.6 kg), SpO2 98 %. Body mass index is 29.91 kg/m.  General: Cooperative, alert, well developed, in no acute distress. HEENT: Conjunctivae and lids unremarkable. Cardiovascular: Regular rhythm.  Lungs: Normal work of breathing. Neurologic: No focal deficits.   Lab Results  Component Value Date   CREATININE 0.90 07/07/2020   BUN 23 07/07/2020   NA 140 07/07/2020   K 3.8 07/07/2020   CL 103 07/07/2020   CO2 24 07/07/2020   Lab Results  Component Value Date   ALT 22 07/07/2020   AST 24 07/07/2020   ALKPHOS 76 07/07/2020   BILITOT 0.6 07/07/2020   Lab Results  Component Value Date   HGBA1C 5.5 07/07/2020   HGBA1C 5.6 01/28/2020   Lab Results  Component Value Date   INSULIN 7.8 07/07/2020   INSULIN 7.8 01/28/2020   Lab Results  Component Value Date   TSH 0.956 01/28/2020   Lab Results  Component Value Date   CHOL 245 (H) 07/07/2020   HDL 44 07/07/2020   LDLCALC 184 (H) 07/07/2020   TRIG 97 07/07/2020   Lab Results  Component Value Date   WBC 6.2 07/07/2020   HGB 14.9 07/07/2020   HCT 45.8 07/07/2020   MCV 90 07/07/2020   PLT 269 07/07/2020   No results found for: IRON, TIBC, FERRITIN  Obesity Behavioral  Intervention:   Approximately 15 minutes were spent on the discussion below.  ASK: We discussed the diagnosis of obesity with Brendan Cox today and Brendan Cox agreed to give [place] permission to discuss obesity behavioral modification therapy today.  ASSESS: Brendan Cox has the diagnosis of obesity and his BMI today is 29.91. Brendan Cox is in the action stage of change.   ADVISE: Brendan Cox was educated on the multiple health risks of obesity as well as the benefit of weight loss to improve his health. He was advised of the need for long term treatment and the importance of lifestyle modifications to improve his current health and to decrease his risk of future health problems.  AGREE: Multiple dietary modification options and treatment options were discussed and Brendan Cox agreed to follow the recommendations documented in the above note.  ARRANGE: Brendan Cox was educated on the importance of frequent visits to treat obesity as outlined per CMS and USPSTF guidelines and agreed to schedule his next follow up appointment today.  Attestation Statements:   Reviewed by clinician on day of visit: allergies, medications, problem list, medical history, surgical history, family history, social history, and previous encounter notes.   I, Burt Knack, am acting as transcriptionist for Quillian Quince, MD.  I have reviewed the above documentation for accuracy and completeness, and I agree with the above. -  Quillian Quince, MD

## 2020-09-14 DIAGNOSIS — Z23 Encounter for immunization: Secondary | ICD-10-CM | POA: Diagnosis not present

## 2020-09-29 DIAGNOSIS — G894 Chronic pain syndrome: Secondary | ICD-10-CM | POA: Diagnosis not present

## 2020-10-02 ENCOUNTER — Other Ambulatory Visit (INDEPENDENT_AMBULATORY_CARE_PROVIDER_SITE_OTHER): Payer: Self-pay | Admitting: Family Medicine

## 2020-10-02 DIAGNOSIS — E559 Vitamin D deficiency, unspecified: Secondary | ICD-10-CM

## 2020-10-05 ENCOUNTER — Telehealth: Payer: Self-pay | Admitting: Pharmacist

## 2020-10-05 DIAGNOSIS — Z23 Encounter for immunization: Secondary | ICD-10-CM | POA: Diagnosis not present

## 2020-10-05 NOTE — Telephone Encounter (Signed)
Pt called clinic stating his PCP recommend that he try CBD. Advised him that he should NOT take CBD since it interacts with his Plavix and makes Plavix less effective. Pt is aware of recommendation and will not start CBD.

## 2020-10-06 DIAGNOSIS — M62830 Muscle spasm of back: Secondary | ICD-10-CM | POA: Diagnosis not present

## 2020-10-06 DIAGNOSIS — M5136 Other intervertebral disc degeneration, lumbar region: Secondary | ICD-10-CM | POA: Diagnosis not present

## 2020-10-06 DIAGNOSIS — M5416 Radiculopathy, lumbar region: Secondary | ICD-10-CM | POA: Diagnosis not present

## 2020-10-07 ENCOUNTER — Ambulatory Visit (INDEPENDENT_AMBULATORY_CARE_PROVIDER_SITE_OTHER): Payer: Medicare Other | Admitting: Family Medicine

## 2020-10-07 ENCOUNTER — Other Ambulatory Visit: Payer: Self-pay

## 2020-10-07 ENCOUNTER — Encounter (INDEPENDENT_AMBULATORY_CARE_PROVIDER_SITE_OTHER): Payer: Self-pay | Admitting: Family Medicine

## 2020-10-07 VITALS — BP 150/74 | Temp 98.4°F | Ht 67.0 in | Wt 192.0 lb

## 2020-10-07 DIAGNOSIS — E669 Obesity, unspecified: Secondary | ICD-10-CM | POA: Diagnosis not present

## 2020-10-07 DIAGNOSIS — E559 Vitamin D deficiency, unspecified: Secondary | ICD-10-CM

## 2020-10-07 DIAGNOSIS — R7303 Prediabetes: Secondary | ICD-10-CM | POA: Diagnosis not present

## 2020-10-07 DIAGNOSIS — Z6830 Body mass index (BMI) 30.0-30.9, adult: Secondary | ICD-10-CM

## 2020-10-07 MED ORDER — VITAMIN D (ERGOCALCIFEROL) 1.25 MG (50000 UNIT) PO CAPS: 50000.0000 [IU] | ORAL_CAPSULE | ORAL | 0 refills | Status: DC

## 2020-10-13 NOTE — Progress Notes (Signed)
Chief Complaint:   OBESITY Brendan Cox is here to discuss his progress with his obesity treatment plan along with follow-up of his obesity related diagnoses. Brendan Cox is on keeping a food journal and adhering to recommended goals of 1500-1700 calories and 85+ grams of protein daily and states he is following his eating plan approximately 80% of the time. Brendan Cox states he is doing 0 minutes 0 times per week.  Today's visit was #: 12 Starting weight: 215 lbs Starting date: 01/28/2020 Today's weight: 192 lbs Today's date: 10/07/2020 Total lbs lost to date: 23 Total lbs lost since last in-office visit: 0  Interim History: Brendan Cox is working on meal planning and is getting a bit bored with his breakfast options. His exercise is limited due to his back pain, but he is still trying to be active when he can.  Subjective:   1. Vitamin D deficiency Brendan Cox is stable on Vit D, and he denies nausea, vomiting, or muscle weakness.  2. Pre-diabetes Brendan Cox is stable on his diet prescription. He struggles with some snacking while in pain, but overall is doing very well with diet and weight loss.  Assessment/Plan:   1. Vitamin D deficiency Low Vitamin D level contributes to fatigue and are associated with obesity, breast, and colon cancer. We will refill prescription Vitamin D for 1 month. We will recheck labs in 1 month. Brendan Cox will follow-up for routine testing of Vitamin D, at least 2-3 times per year to avoid over-replacement.  - Vitamin D, Ergocalciferol, (DRISDOL) 1.25 MG (50000 UNIT) CAPS capsule; Take 1 capsule (50,000 Units total) by mouth every 7 (seven) days.  Dispense: 4 capsule; Refill: 0  2. Pre-diabetes Brendan Cox will continue to work on weight loss, diet, exercise, and decreasing simple carbohydrates to help decrease the risk of diabetes. We will recheck labs in 1 month.  3. Class 1 obesity with serious comorbidity and body mass index (BMI) of 30.0 to 30.9 in adult, unspecified obesity  type Brendan Cox is currently in the action stage of change. As such, his goal is to continue with weight loss efforts. He has agreed to the Category 2 Plan or keeping a food journal and adhering to recommended goals of 1750 calories and 90+ grams of protein daily.   We will recheck fasting labs at his next visit.  Behavioral modification strategies: increasing lean protein intake and meal planning and cooking strategies.  Brendan Cox has agreed to follow-up with our clinic in 4 weeks. He was informed of the importance of frequent follow-up visits to maximize his success with intensive lifestyle modifications for his multiple health conditions.   Objective:   Blood pressure (!) 150/74, temperature 98.4 F (36.9 C), height 5\' 7"  (1.702 m), weight 192 lb (87.1 kg), SpO2 98 %. Body mass index is 30.07 kg/m.  General: Cooperative, alert, well developed, in no acute distress. HEENT: Conjunctivae and lids unremarkable. Cardiovascular: Regular rhythm.  Lungs: Normal work of breathing. Neurologic: No focal deficits.   Lab Results  Component Value Date   CREATININE 0.90 07/07/2020   BUN 23 07/07/2020   NA 140 07/07/2020   K 3.8 07/07/2020   CL 103 07/07/2020   CO2 24 07/07/2020   Lab Results  Component Value Date   ALT 22 07/07/2020   AST 24 07/07/2020   ALKPHOS 76 07/07/2020   BILITOT 0.6 07/07/2020   Lab Results  Component Value Date   HGBA1C 5.5 07/07/2020   HGBA1C 5.6 01/28/2020   Lab Results  Component Value Date  INSULIN 7.8 07/07/2020   INSULIN 7.8 01/28/2020   Lab Results  Component Value Date   TSH 0.956 01/28/2020   Lab Results  Component Value Date   CHOL 245 (H) 07/07/2020   HDL 44 07/07/2020   LDLCALC 184 (H) 07/07/2020   TRIG 97 07/07/2020   Lab Results  Component Value Date   WBC 6.2 07/07/2020   HGB 14.9 07/07/2020   HCT 45.8 07/07/2020   MCV 90 07/07/2020   PLT 269 07/07/2020   No results found for: IRON, TIBC, FERRITIN  Obesity Behavioral  Intervention:   Approximately 15 minutes were spent on the discussion below.  ASK: We discussed the diagnosis of obesity with Brendan Cox today and Brendan Cox agreed to give [place] permission to discuss obesity behavioral modification therapy today.  ASSESS: Brendan Cox has the diagnosis of obesity and his BMI today is 30.06. Brendan Cox is in the action stage of change.   ADVISE: Brendan Cox was educated on the multiple health risks of obesity as well as the benefit of weight loss to improve his health. He was advised of the need for long term treatment and the importance of lifestyle modifications to improve his current health and to decrease his risk of future health problems.  AGREE: Multiple dietary modification options and treatment options were discussed and Brendan Cox agreed to follow the recommendations documented in the above note.  ARRANGE: Brendan Cox was educated on the importance of frequent visits to treat obesity as outlined per CMS and USPSTF guidelines and agreed to schedule his next follow up appointment today.  Attestation Statements:   Reviewed by clinician on day of visit: allergies, medications, problem list, medical history, surgical history, family history, social history, and previous encounter notes.   I, Burt Knack, am acting as transcriptionist for Quillian Quince, MD.  I have reviewed the above documentation for accuracy and completeness, and I agree with the above. -  Quillian Quince, MD

## 2020-10-26 DIAGNOSIS — Z23 Encounter for immunization: Secondary | ICD-10-CM | POA: Diagnosis not present

## 2020-10-29 ENCOUNTER — Encounter (INDEPENDENT_AMBULATORY_CARE_PROVIDER_SITE_OTHER): Payer: Self-pay | Admitting: Family Medicine

## 2020-10-29 DIAGNOSIS — M5416 Radiculopathy, lumbar region: Secondary | ICD-10-CM | POA: Diagnosis not present

## 2020-10-29 DIAGNOSIS — M5136 Other intervertebral disc degeneration, lumbar region: Secondary | ICD-10-CM | POA: Diagnosis not present

## 2020-11-04 ENCOUNTER — Encounter (INDEPENDENT_AMBULATORY_CARE_PROVIDER_SITE_OTHER): Payer: Self-pay | Admitting: Family Medicine

## 2020-11-04 ENCOUNTER — Ambulatory Visit (INDEPENDENT_AMBULATORY_CARE_PROVIDER_SITE_OTHER): Payer: Medicare Other | Admitting: Family Medicine

## 2020-11-04 ENCOUNTER — Other Ambulatory Visit: Payer: Self-pay

## 2020-11-04 VITALS — BP 120/71 | HR 55 | Temp 98.1°F | Ht 67.0 in | Wt 190.0 lb

## 2020-11-04 DIAGNOSIS — R7303 Prediabetes: Secondary | ICD-10-CM

## 2020-11-04 DIAGNOSIS — Z6830 Body mass index (BMI) 30.0-30.9, adult: Secondary | ICD-10-CM

## 2020-11-04 DIAGNOSIS — E782 Mixed hyperlipidemia: Secondary | ICD-10-CM

## 2020-11-04 DIAGNOSIS — E559 Vitamin D deficiency, unspecified: Secondary | ICD-10-CM

## 2020-11-04 DIAGNOSIS — E669 Obesity, unspecified: Secondary | ICD-10-CM

## 2020-11-05 ENCOUNTER — Other Ambulatory Visit (INDEPENDENT_AMBULATORY_CARE_PROVIDER_SITE_OTHER): Payer: Self-pay | Admitting: Family Medicine

## 2020-11-05 DIAGNOSIS — E559 Vitamin D deficiency, unspecified: Secondary | ICD-10-CM

## 2020-11-05 LAB — LIPID PANEL WITH LDL/HDL RATIO
Cholesterol, Total: 257 mg/dL — ABNORMAL HIGH (ref 100–199)
HDL: 53 mg/dL (ref >39)
LDL Chol Calc (NIH): 190 mg/dL — ABNORMAL HIGH (ref 0–99)
LDL/HDL Ratio: 3.6 ratio (ref 0.0–3.6)
Triglycerides: 82 mg/dL (ref 0–149)
VLDL Cholesterol Cal: 14 mg/dL (ref 5–40)

## 2020-11-05 LAB — INSULIN, RANDOM: INSULIN: 11.3 u[IU]/mL (ref 2.6–24.9)

## 2020-11-05 LAB — COMPREHENSIVE METABOLIC PANEL
ALT: 38 IU/L (ref 0–44)
AST: 25 IU/L (ref 0–40)
Albumin/Globulin Ratio: 2 (ref 1.2–2.2)
Albumin: 4.4 g/dL (ref 3.8–4.8)
Alkaline Phosphatase: 81 IU/L (ref 44–121)
BUN/Creatinine Ratio: 27 — ABNORMAL HIGH (ref 10–24)
BUN: 22 mg/dL (ref 8–27)
Bilirubin Total: 0.4 mg/dL (ref 0.0–1.2)
CO2: 24 mmol/L (ref 20–29)
Calcium: 9.2 mg/dL (ref 8.6–10.2)
Chloride: 104 mmol/L (ref 96–106)
Creatinine, Ser: 0.82 mg/dL (ref 0.76–1.27)
GFR calc Af Amer: 107 mL/min/{1.73_m2} (ref >59)
GFR calc non Af Amer: 92 mL/min/{1.73_m2} (ref >59)
Globulin, Total: 2.2 g/dL (ref 1.5–4.5)
Glucose: 108 mg/dL — ABNORMAL HIGH (ref 65–99)
Potassium: 4.2 mmol/L (ref 3.5–5.2)
Sodium: 139 mmol/L (ref 134–144)
Total Protein: 6.6 g/dL (ref 6.0–8.5)

## 2020-11-05 LAB — VITAMIN D 25 HYDROXY (VIT D DEFICIENCY, FRACTURES): Vit D, 25-Hydroxy: 42 ng/mL (ref 30.0–100.0)

## 2020-11-05 LAB — HEMOGLOBIN A1C
Est. average glucose Bld gHb Est-mCnc: 111 mg/dL
Hgb A1c MFr Bld: 5.5 % (ref 4.8–5.6)

## 2020-11-05 NOTE — Progress Notes (Signed)
Chief Complaint:   OBESITY Brendan Cox is here to discuss his progress with his obesity treatment plan along with follow-up of his obesity related diagnoses. Brendan Cox is on the Category 2 Plan or keeping a food journal and adhering to recommended goals of 1750 calories and 90+ grams of protein daily and states he is following his eating plan approximately 75% of the time. Brendan Cox states he is doing 0 minutes 0 times per week.  Today's visit was #: 13 Starting weight: 215 lbs Starting date: 01/28/2020 Today's weight: 190 lbs Today's date: 11/04/2020 Total lbs lost to date: 25 Total lbs lost since last in-office visit: 2  Interim History: Brendan Cox continues to do very well with weight loss. He has lost >10% total body weight in 9 months, and he is tolerating his eating plan well. He is willing to discuss his Thanksgiving plans and eating strategies for the holidays.  Subjective:   1. Pre-diabetes Brendan Cox's last fasting glucose was improving. Her is due to have labs checked again. He is doing well with diet and exercise.  2. Vitamin D deficiency Brendan Cox is stable on Vit D, but his Vit D level is not yet at goal.  3. Mixed hyperlipidemia Brendan Cox is doing well on his diet. He is due to have labs to check his progress.  Assessment/Plan:   1. Pre-diabetes Brendan Cox will continue to work on weight loss, exercise, and decreasing simple carbohydrates to help decrease the risk of diabetes. We will check labs today.  - Insulin, random - Hemoglobin A1c  2. Vitamin D deficiency Low Vitamin D level contributes to fatigue and are associated with obesity, breast, and colon cancer. We will check labs today. Brendan Cox will follow-up for routine testing of Vitamin D, at least 2-3 times per year to avoid over-replacement.  - VITAMIN D 25 Hydroxy (Vit-D Deficiency, Fractures)  3. Mixed hyperlipidemia Cardiovascular risk and specific lipid/LDL goals reviewed. We discussed several lifestyle modifications today  and Brendan Cox will continue to work on diet, exercise and weight loss efforts. We will check labs today. Orders and follow up as documented in patient record.   - Comprehensive metabolic panel - Lipid Panel With LDL/HDL Ratio  4. Class 1 obesity with serious comorbidity and body mass index (BMI) of 30.0 to 30.9 in adult, unspecified obesity type Brendan Cox is currently in the action stage of change. As such, his goal is to continue with weight loss efforts. He has agreed to keeping a food journal and adhering to recommended goals of 1700 calories and 90+ grams of protein daily.   Behavioral modification strategies: holiday eating strategies .  Brendan Cox has agreed to follow-up with our clinic in 4 weeks. He was informed of the importance of frequent follow-up visits to maximize his success with intensive lifestyle modifications for his multiple health conditions.   Brendan Cox was informed we would discuss his lab results at his next visit unless there is a critical issue that needs to be addressed sooner. Brendan Cox agreed to keep his next visit at the agreed upon time to discuss these results.  Objective:   Blood pressure 120/71, pulse (!) 55, temperature 98.1 F (36.7 C), height 5\' 7"  (1.702 m), weight 190 lb (86.2 kg), SpO2 96 %. Body mass index is 29.76 kg/m.  General: Cooperative, alert, well developed, in no acute distress. HEENT: Conjunctivae and lids unremarkable. Cardiovascular: Regular rhythm.  Lungs: Normal work of breathing. Neurologic: No focal deficits.   Lab Results  Component Value Date   CREATININE 0.82 11/04/2020  BUN 22 11/04/2020   NA 139 11/04/2020   K 4.2 11/04/2020   CL 104 11/04/2020   CO2 24 11/04/2020   Lab Results  Component Value Date   ALT 38 11/04/2020   AST 25 11/04/2020   ALKPHOS 81 11/04/2020   BILITOT 0.4 11/04/2020   Lab Results  Component Value Date   HGBA1C 5.5 11/04/2020   HGBA1C 5.5 07/07/2020   HGBA1C 5.6 01/28/2020   Lab Results  Component  Value Date   INSULIN 11.3 11/04/2020   INSULIN 7.8 07/07/2020   INSULIN 7.8 01/28/2020   Lab Results  Component Value Date   TSH 0.956 01/28/2020   Lab Results  Component Value Date   CHOL 257 (H) 11/04/2020   HDL 53 11/04/2020   LDLCALC 190 (H) 11/04/2020   TRIG 82 11/04/2020   Lab Results  Component Value Date   WBC 6.2 07/07/2020   HGB 14.9 07/07/2020   HCT 45.8 07/07/2020   MCV 90 07/07/2020   PLT 269 07/07/2020   No results found for: IRON, TIBC, FERRITIN  Obesity Behavioral Intervention:   Approximately 15 minutes were spent on the discussion below.  ASK: We discussed the diagnosis of obesity with Brendan Cox today and Brendan Cox agreed to give [place] permission to discuss obesity behavioral modification therapy today.  ASSESS: [place] has the diagnosis of obesity and his BMI today is 29.75. Brendan Cox is in the action stage of change.   ADVISE: Brendan Cox was educated on the multiple health risks of obesity as well as the benefit of weight loss to improve his health. He was advised of the need for long term treatment and the importance of lifestyle modifications to improve his current health and to decrease his risk of future health problems.  AGREE: Multiple dietary modification options and treatment options were discussed and Brendan Cox agreed to follow the recommendations documented in the above note.  ARRANGE: Brendan Cox was educated on the importance of frequent visits to treat obesity as outlined per CMS and USPSTF guidelines and agreed to schedule his next follow up appointment today.  Attestation Statements:   Reviewed by clinician on day of visit: allergies, medications, problem list, medical history, surgical history, family history, social history, and previous encounter notes.   I, Burt Knack, am acting as transcriptionist for Quillian Quince, MD.  I have reviewed the above documentation for accuracy and completeness, and I agree with the above. -  Quillian Quince, MD

## 2020-11-09 ENCOUNTER — Encounter (INDEPENDENT_AMBULATORY_CARE_PROVIDER_SITE_OTHER): Payer: Self-pay

## 2020-11-09 NOTE — Telephone Encounter (Signed)
MyChart message sent to pt to find out if they have enough medication to get them through until next appt.   

## 2020-11-18 ENCOUNTER — Other Ambulatory Visit (INDEPENDENT_AMBULATORY_CARE_PROVIDER_SITE_OTHER): Payer: Self-pay

## 2020-11-18 DIAGNOSIS — E559 Vitamin D deficiency, unspecified: Secondary | ICD-10-CM

## 2020-11-18 MED ORDER — VITAMIN D (ERGOCALCIFEROL) 1.25 MG (50000 UNIT) PO CAPS: 50000.0000 [IU] | ORAL_CAPSULE | ORAL | 0 refills | Status: DC

## 2020-12-01 DIAGNOSIS — H2513 Age-related nuclear cataract, bilateral: Secondary | ICD-10-CM | POA: Diagnosis not present

## 2020-12-01 DIAGNOSIS — M62830 Muscle spasm of back: Secondary | ICD-10-CM | POA: Diagnosis not present

## 2020-12-01 DIAGNOSIS — H40013 Open angle with borderline findings, low risk, bilateral: Secondary | ICD-10-CM | POA: Diagnosis not present

## 2020-12-02 ENCOUNTER — Ambulatory Visit (INDEPENDENT_AMBULATORY_CARE_PROVIDER_SITE_OTHER): Payer: Medicare Other | Admitting: Family Medicine

## 2020-12-02 ENCOUNTER — Encounter (INDEPENDENT_AMBULATORY_CARE_PROVIDER_SITE_OTHER): Payer: Self-pay | Admitting: Family Medicine

## 2020-12-02 ENCOUNTER — Other Ambulatory Visit: Payer: Self-pay

## 2020-12-02 VITALS — BP 137/71 | HR 65 | Temp 97.8°F | Ht 67.0 in | Wt 192.0 lb

## 2020-12-02 DIAGNOSIS — Z6830 Body mass index (BMI) 30.0-30.9, adult: Secondary | ICD-10-CM | POA: Diagnosis not present

## 2020-12-02 DIAGNOSIS — E559 Vitamin D deficiency, unspecified: Secondary | ICD-10-CM | POA: Diagnosis not present

## 2020-12-02 DIAGNOSIS — E669 Obesity, unspecified: Secondary | ICD-10-CM | POA: Diagnosis not present

## 2020-12-02 MED ORDER — VITAMIN D (ERGOCALCIFEROL) 1.25 MG (50000 UNIT) PO CAPS: 50000.0000 [IU] | ORAL_CAPSULE | ORAL | 0 refills | Status: DC

## 2020-12-02 NOTE — Progress Notes (Signed)
Chief Complaint:   OBESITY Brendan Cox is here to discuss his progress with his obesity treatment plan along with follow-up of his obesity related diagnoses. Brendan Cox is on keeping a food journal and adhering to recommended goals of 1700 calories and 90+ grams of protein daily and states he is following his eating plan approximately 70% of the time. Brendan Cox states he is working outside 3-4 times per week.  Today's visit was #: 14 Starting weight: 215 lbs Starting date: 01/28/2020 Today's weight: 192 lbs Today's date: 12/02/2020 Total lbs lost to date: 23 Total lbs lost since last in-office visit: 0  Interim History: Brendan Cox did some celebrations eating over Thanksgiving and he has gained a bit of weight, but he is already back on track. He would like more protein rich breakfast options.  Subjective:   1. Vitamin D deficiency Brendan Cox is stable on Vit D, and he denies nausea or vomiting. He requests a refill today.  Assessment/Plan:   1. Vitamin D deficiency Low Vitamin D level contributes to fatigue and are associated with obesity, breast, and colon cancer. We will refill prescription Vitamin D for 1 month. Brendan Cox will follow-up for routine testing of Vitamin D, at least 2-3 times per year to avoid over-replacement.  - Vitamin D, Ergocalciferol, (DRISDOL) 1.25 MG (50000 UNIT) CAPS capsule; Take 1 capsule (50,000 Units total) by mouth every 7 (seven) days.  Dispense: 4 capsule; Refill: 0  2. Class 1 obesity with serious comorbidity and body mass index (BMI) of 30.0 to 30.9 in adult, unspecified obesity type Brendan Cox is currently in the action stage of change. As such, his goal is to continue with weight loss efforts. He has agreed to keeping a food journal and adhering to recommended goals of 1700 calories and 90+ grams of protein daily.   Higher protein breakfast recipes were given.  Exercise goals: As is.  Behavioral modification strategies: increasing lean protein intake, meal planning  and cooking strategies and holiday eating strategies .  Brendan Cox has agreed to follow-up with our clinic in 4 weeks. He was informed of the importance of frequent follow-up visits to maximize his success with intensive lifestyle modifications for his multiple health conditions.   Objective:   Blood pressure 137/71, pulse 65, temperature 97.8 F (36.6 C), height 5\' 7"  (1.702 m), weight 192 lb (87.1 kg), SpO2 99 %. Body mass index is 30.07 kg/m.  General: Cooperative, alert, well developed, in no acute distress. HEENT: Conjunctivae and lids unremarkable. Cardiovascular: Regular rhythm.  Lungs: Normal work of breathing. Neurologic: No focal deficits.   Lab Results  Component Value Date   CREATININE 0.82 11/04/2020   BUN 22 11/04/2020   NA 139 11/04/2020   K 4.2 11/04/2020   CL 104 11/04/2020   CO2 24 11/04/2020   Lab Results  Component Value Date   ALT 38 11/04/2020   AST 25 11/04/2020   ALKPHOS 81 11/04/2020   BILITOT 0.4 11/04/2020   Lab Results  Component Value Date   HGBA1C 5.5 11/04/2020   HGBA1C 5.5 07/07/2020   HGBA1C 5.6 01/28/2020   Lab Results  Component Value Date   INSULIN 11.3 11/04/2020   INSULIN 7.8 07/07/2020   INSULIN 7.8 01/28/2020   Lab Results  Component Value Date   TSH 0.956 01/28/2020   Lab Results  Component Value Date   CHOL 257 (H) 11/04/2020   HDL 53 11/04/2020   LDLCALC 190 (H) 11/04/2020   TRIG 82 11/04/2020   Lab Results  Component Value  Date   WBC 6.2 07/07/2020   HGB 14.9 07/07/2020   HCT 45.8 07/07/2020   MCV 90 07/07/2020   PLT 269 07/07/2020   No results found for: IRON, TIBC, FERRITIN  Obesity Behavioral Intervention:   Approximately 15 minutes were spent on the discussion below.  ASK: We discussed the diagnosis of obesity with Brendan Cox today and Brendan Cox agreed to give [place] permission to discuss obesity behavioral modification therapy today.  ASSESS: Brendan Cox has the diagnosis of obesity and his BMI today is 30.06.  Brendan Cox is in the action stage of change.   ADVISE: Brendan Cox was educated on the multiple health risks of obesity as well as the benefit of weight loss to improve his health. He was advised of the need for long term treatment and the importance of lifestyle modifications to improve his current health and to decrease his risk of future health problems.  AGREE: Multiple dietary modification options and treatment options were discussed and Brendan Cox agreed to follow the recommendations documented in the above note.  ARRANGE: Brendan Cox was educated on the importance of frequent visits to treat obesity as outlined per CMS and USPSTF guidelines and agreed to schedule his next follow up appointment today.  Attestation Statements:   Reviewed by clinician on day of visit: allergies, medications, problem list, medical history, surgical history, family history, social history, and previous encounter notes.   I, Burt Knack, am acting as transcriptionist for Quillian Quince, MD.  I have reviewed the above documentation for accuracy and completeness, and I agree with the above. -  Quillian Quince, MD

## 2020-12-23 DIAGNOSIS — M150 Primary generalized (osteo)arthritis: Secondary | ICD-10-CM | POA: Diagnosis not present

## 2020-12-23 DIAGNOSIS — E782 Mixed hyperlipidemia: Secondary | ICD-10-CM | POA: Diagnosis not present

## 2020-12-23 DIAGNOSIS — I10 Essential (primary) hypertension: Secondary | ICD-10-CM | POA: Diagnosis not present

## 2020-12-23 DIAGNOSIS — I2510 Atherosclerotic heart disease of native coronary artery without angina pectoris: Secondary | ICD-10-CM | POA: Diagnosis not present

## 2020-12-23 DIAGNOSIS — G8929 Other chronic pain: Secondary | ICD-10-CM | POA: Diagnosis not present

## 2020-12-23 DIAGNOSIS — G4733 Obstructive sleep apnea (adult) (pediatric): Secondary | ICD-10-CM | POA: Diagnosis not present

## 2021-01-04 ENCOUNTER — Ambulatory Visit (INDEPENDENT_AMBULATORY_CARE_PROVIDER_SITE_OTHER): Payer: Medicare Other | Admitting: Family Medicine

## 2021-01-13 ENCOUNTER — Encounter (INDEPENDENT_AMBULATORY_CARE_PROVIDER_SITE_OTHER): Payer: Self-pay | Admitting: Family Medicine

## 2021-01-13 ENCOUNTER — Other Ambulatory Visit: Payer: Self-pay

## 2021-01-13 ENCOUNTER — Ambulatory Visit (INDEPENDENT_AMBULATORY_CARE_PROVIDER_SITE_OTHER): Payer: Medicare Other | Admitting: Family Medicine

## 2021-01-13 VITALS — BP 134/69 | HR 60 | Temp 98.0°F | Ht 67.0 in | Wt 191.0 lb

## 2021-01-13 DIAGNOSIS — I10 Essential (primary) hypertension: Secondary | ICD-10-CM | POA: Diagnosis not present

## 2021-01-13 DIAGNOSIS — E669 Obesity, unspecified: Secondary | ICD-10-CM

## 2021-01-13 DIAGNOSIS — E559 Vitamin D deficiency, unspecified: Secondary | ICD-10-CM | POA: Diagnosis not present

## 2021-01-13 DIAGNOSIS — Z6830 Body mass index (BMI) 30.0-30.9, adult: Secondary | ICD-10-CM

## 2021-01-13 MED ORDER — VITAMIN D (ERGOCALCIFEROL) 1.25 MG (50000 UNIT) PO CAPS: 50000.0000 [IU] | ORAL_CAPSULE | ORAL | 1 refills | Status: DC

## 2021-01-14 NOTE — Progress Notes (Signed)
Chief Complaint:   OBESITY Brendan Cox Cox here to discuss his progress with his obesity treatment plan along with follow-up of his obesity related diagnoses. Brendan Cox Cox on keeping a food journal and adhering to recommended goals of 1700 calories and 90+ grams of protein daily and states he Cox following his eating plan approximately 80% of the time. Brendan Cox states he Cox doing yard work and walking around [employer].  Today's visit was #: 15 Starting weight: 215 lbs Starting date: 01/28/2020 Today's weight: 191 lbs Today's date: 01/13/2021 Total lbs lost to date: 24 Total lbs lost since last in-office visit: 1  Interim History: Brendan Cox has done well avoiding weight gain over the holidays. He did some celebration eating, but was able to get back on track well. He Cox trying to make smarter PM snacking choices.  Subjective:   1. Vitamin D deficiency Brendan Cox Cox stable on Vit D, and he denies nausea, vomiting, or muscle weakness.  2. Essential hypertension Brendan Cox's blood pressure Cox well controlled with diet and medications. He denies signs of lightheadedness.  Assessment/Plan:   1. Vitamin D deficiency Low Vitamin D level contributes to fatigue and are associated with obesity, breast, and colon cancer. We will refill prescription Vitamin D for 2 month. Brendan Cox will follow-up for routine testing of Vitamin D, at least 2-3 times per year to avoid over-replacement.  - Vitamin D, Ergocalciferol, (DRISDOL) 1.25 MG (50000 UNIT) CAPS capsule; Take 1 capsule (50,000 Units total) by mouth every 7 (seven) days.  Dispense: 4 capsule; Refill: 1  2. Essential hypertension Brendan Cox Cox working on healthy weight loss, diet, and exercise to improve blood pressure control. We will watch for signs of hypotension Brendan he continues his lifestyle modifications.  3. Class 1 obesity with serious comorbidity and body mass index (BMI) of 30.0 to 30.9 in adult, unspecified obesity type Brendan Cox Cox currently in the action stage  of change. Brendan such, his goal Cox to continue with weight loss efforts. He has agreed to keeping a food journal and adhering to recommended goals of 1700 calories and 90 grams of protein daily.   Exercise goals: Brendan Cox.  Behavioral modification strategies: decreasing simple carbohydrates.  Brendan Cox has agreed to follow-up with our clinic in 6 weeks. He was informed of the importance of frequent follow-up visits to maximize his success with intensive lifestyle modifications for his multiple health conditions.   Objective:   Blood pressure 134/69, pulse 60, temperature 98 F (36.7 C), height 5\' 7"  (1.702 m), weight 191 lb (86.6 kg), SpO2 98 %. Body mass index Cox 29.91 kg/m.  General: Cooperative, alert, well developed, in no acute distress. HEENT: Conjunctivae and lids unremarkable. Cardiovascular: Regular rhythm.  Lungs: Normal work of breathing. Neurologic: No focal deficits.   Lab Results  Component Value Date   CREATININE 0.82 11/04/2020   BUN 22 11/04/2020   NA 139 11/04/2020   K 4.2 11/04/2020   CL 104 11/04/2020   CO2 24 11/04/2020   Lab Results  Component Value Date   ALT 38 11/04/2020   AST 25 11/04/2020   ALKPHOS 81 11/04/2020   BILITOT 0.4 11/04/2020   Lab Results  Component Value Date   HGBA1C 5.5 11/04/2020   HGBA1C 5.5 07/07/2020   HGBA1C 5.6 01/28/2020   Lab Results  Component Value Date   INSULIN 11.3 11/04/2020   INSULIN 7.8 07/07/2020   INSULIN 7.8 01/28/2020   Lab Results  Component Value Date   TSH 0.956 01/28/2020  Lab Results  Component Value Date   CHOL 257 (H) 11/04/2020   HDL 53 11/04/2020   LDLCALC 190 (H) 11/04/2020   TRIG 82 11/04/2020   Lab Results  Component Value Date   WBC 6.2 07/07/2020   HGB 14.9 07/07/2020   HCT 45.8 07/07/2020   MCV 90 07/07/2020   PLT 269 07/07/2020   No results found for: IRON, TIBC, FERRITIN  Obesity Behavioral Intervention:   Approximately 15 minutes were spent on the discussion  below.  ASK: We discussed the diagnosis of obesity with Brendan Cox today and Brendan Cox agreed to give [place] permission to discuss obesity behavioral modification therapy today.  ASSESS: Brendan Cox has the diagnosis of obesity and his BMI today Cox 29.91. Brendan Cox Cox in the action stage of change.   ADVISE: Brendan Cox was educated on the multiple health risks of obesity Brendan well Brendan the benefit of weight loss to improve his health. He was advised of the need for long term treatment and the importance of lifestyle modifications to improve his current health and to decrease his risk of future health problems.  AGREE: Multiple dietary modification options and treatment options were discussed and Brendan Cox agreed to follow the recommendations documented in the above note.  ARRANGE: Brendan Cox was educated on the importance of frequent visits to treat obesity Brendan outlined per CMS and USPSTF guidelines and agreed to schedule his next follow up appointment today.  Attestation Statements:   Reviewed by clinician on day of visit: allergies, medications, problem list, medical history, surgical history, family history, social history, and previous encounter notes.   I, Burt Knack, am acting Brendan transcriptionist for Quillian Quince, MD.  I have reviewed the above documentation for accuracy and completeness, and I agree with the above. -  Quillian Quince, MD

## 2021-02-11 DIAGNOSIS — M5416 Radiculopathy, lumbar region: Secondary | ICD-10-CM | POA: Diagnosis not present

## 2021-02-17 DIAGNOSIS — G43109 Migraine with aura, not intractable, without status migrainosus: Secondary | ICD-10-CM | POA: Diagnosis not present

## 2021-02-22 ENCOUNTER — Other Ambulatory Visit: Payer: Self-pay

## 2021-02-22 ENCOUNTER — Encounter (INDEPENDENT_AMBULATORY_CARE_PROVIDER_SITE_OTHER): Payer: Self-pay | Admitting: Family Medicine

## 2021-02-22 ENCOUNTER — Ambulatory Visit (INDEPENDENT_AMBULATORY_CARE_PROVIDER_SITE_OTHER): Payer: Medicare Other | Admitting: Family Medicine

## 2021-02-22 VITALS — BP 134/70 | HR 54 | Temp 98.0°F | Ht 67.0 in | Wt 193.0 lb

## 2021-02-22 DIAGNOSIS — E559 Vitamin D deficiency, unspecified: Secondary | ICD-10-CM | POA: Diagnosis not present

## 2021-02-22 DIAGNOSIS — Z6830 Body mass index (BMI) 30.0-30.9, adult: Secondary | ICD-10-CM

## 2021-02-22 DIAGNOSIS — E669 Obesity, unspecified: Secondary | ICD-10-CM

## 2021-02-22 MED ORDER — VITAMIN D (ERGOCALCIFEROL) 1.25 MG (50000 UNIT) PO CAPS: 50000.0000 [IU] | ORAL_CAPSULE | ORAL | 1 refills | Status: AC

## 2021-02-23 NOTE — Progress Notes (Signed)
Chief Complaint:   OBESITY Brendan Cox is here to discuss his progress with his obesity treatment plan along with follow-up of his obesity related diagnoses. Brendan Cox is on keeping a food journal and adhering to recommended goals of 1700 calories and 90 grams of protein daily and states he is following his eating plan approximately 75-80% of the time. Brendan Cox states he is walking 10,000 steps 7 times per week.  Today's visit was #: 16 Starting weight: 215 lbs Starting date: 01/28/2020 Today's weight: 193 lbs Today's date: 02/22/2021 Total lbs lost to date: 22 Total lbs lost since last in-office visit: 0  Interim History: Brendan Cox is in the process of moving. He has increased activity, but also increased snacking due to increased hunger. He is still mindful of his food choices and trying to increase protein.  Subjective:   1. Vitamin D deficiency Brendan Cox's Vit D level was slowly improving, but not yet at goal. He denies nausea, vomiting, or muscle weakness.  Assessment/Plan:   1. Vitamin D deficiency Low Vitamin D level contributes to fatigue and are associated with obesity, breast, and colon cancer. We will refill prescription Vitamin D for 1 month, and we will plan to recheck labs in 1 month. Brendan Cox will follow-up for routine testing of Vitamin D, at least 2-3 times per year to avoid over-replacement.  - Vitamin D, Ergocalciferol, (DRISDOL) 1.25 MG (50000 UNIT) CAPS capsule; Take 1 capsule (50,000 Units total) by mouth every 7 (seven) days.  Dispense: 4 capsule; Refill: 1  2. Class 1 obesity with serious comorbidity and body mass index (BMI) of 30.0 to 30.9 in adult, unspecified obesity type Brendan Cox is currently in the action stage of change. As such, his goal is to continue with weight loss efforts. He has agreed to keeping a food journal and adhering to recommended goals of 1700 calories and 90 grams of protein daily.   Exercise goals: As is.  Behavioral modification strategies: meal  planning and cooking strategies and better snacking choices.  Brendan Cox has agreed to follow-up with our clinic in 4 weeks. He was informed of the importance of frequent follow-up visits to maximize his success with intensive lifestyle modifications for his multiple health conditions.   Objective:   Blood pressure 134/70, pulse (!) 54, temperature 98 F (36.7 C), temperature source Oral, height 5\' 7"  (1.702 m), weight 193 lb (87.5 kg), SpO2 98 %. Body mass index is 30.23 kg/m.  General: Cooperative, alert, well developed, in no acute distress. HEENT: Conjunctivae and lids unremarkable. Cardiovascular: Regular rhythm.  Lungs: Normal work of breathing. Neurologic: No focal deficits.   Lab Results  Component Value Date   CREATININE 0.82 11/04/2020   BUN 22 11/04/2020   NA 139 11/04/2020   K 4.2 11/04/2020   CL 104 11/04/2020   CO2 24 11/04/2020   Lab Results  Component Value Date   ALT 38 11/04/2020   AST 25 11/04/2020   ALKPHOS 81 11/04/2020   BILITOT 0.4 11/04/2020   Lab Results  Component Value Date   HGBA1C 5.5 11/04/2020   HGBA1C 5.5 07/07/2020   HGBA1C 5.6 01/28/2020   Lab Results  Component Value Date   INSULIN 11.3 11/04/2020   INSULIN 7.8 07/07/2020   INSULIN 7.8 01/28/2020   Lab Results  Component Value Date   TSH 0.956 01/28/2020   Lab Results  Component Value Date   CHOL 257 (H) 11/04/2020   HDL 53 11/04/2020   LDLCALC 190 (H) 11/04/2020   TRIG 82 11/04/2020  Lab Results  Component Value Date   WBC 6.2 07/07/2020   HGB 14.9 07/07/2020   HCT 45.8 07/07/2020   MCV 90 07/07/2020   PLT 269 07/07/2020   No results found for: IRON, TIBC, FERRITIN  Obesity Behavioral Intervention:   Approximately 15 minutes were spent on the discussion below.  ASK: We discussed the diagnosis of obesity with Brendan Cox today and Brendan Cox agreed to give [place] permission to discuss obesity behavioral modification therapy today.  ASSESS: Brendan Cox has the diagnosis of obesity  and his BMI today is 30.22. Brendan Cox is in the action stage of change.   ADVISE: Brendan Cox was educated on the multiple health risks of obesity as well as the benefit of weight loss to improve his health. He was advised of the need for long term treatment and the importance of lifestyle modifications to improve his current health and to decrease his risk of future health problems.  AGREE: Multiple dietary modification options and treatment options were discussed and Brendan Cox agreed to follow the recommendations documented in the above note.  ARRANGE: Brendan Cox was educated on the importance of frequent visits to treat obesity as outlined per CMS and USPSTF guidelines and agreed to schedule his next follow up appointment today.  Attestation Statements:   Reviewed by clinician on day of visit: allergies, medications, problem list, medical history, surgical history, family history, social history, and previous encounter notes.   I, Burt Knack, am acting as transcriptionist for Quillian Quince, MD.  I have reviewed the above documentation for accuracy and completeness, and I agree with the above. -  Quillian Quince, MD

## 2021-03-26 DIAGNOSIS — G894 Chronic pain syndrome: Secondary | ICD-10-CM | POA: Diagnosis not present

## 2021-03-26 DIAGNOSIS — J309 Allergic rhinitis, unspecified: Secondary | ICD-10-CM | POA: Diagnosis not present

## 2021-03-26 DIAGNOSIS — G479 Sleep disorder, unspecified: Secondary | ICD-10-CM | POA: Diagnosis not present

## 2021-03-26 DIAGNOSIS — M545 Low back pain, unspecified: Secondary | ICD-10-CM | POA: Diagnosis not present

## 2021-03-26 DIAGNOSIS — I10 Essential (primary) hypertension: Secondary | ICD-10-CM | POA: Diagnosis not present

## 2021-03-26 DIAGNOSIS — K219 Gastro-esophageal reflux disease without esophagitis: Secondary | ICD-10-CM | POA: Diagnosis not present

## 2021-03-26 DIAGNOSIS — G4733 Obstructive sleep apnea (adult) (pediatric): Secondary | ICD-10-CM | POA: Diagnosis not present

## 2021-03-26 DIAGNOSIS — E782 Mixed hyperlipidemia: Secondary | ICD-10-CM | POA: Diagnosis not present

## 2021-03-26 DIAGNOSIS — Z0000 Encounter for general adult medical examination without abnormal findings: Secondary | ICD-10-CM | POA: Diagnosis not present

## 2021-03-26 DIAGNOSIS — I2510 Atherosclerotic heart disease of native coronary artery without angina pectoris: Secondary | ICD-10-CM | POA: Diagnosis not present

## 2021-03-29 ENCOUNTER — Encounter (INDEPENDENT_AMBULATORY_CARE_PROVIDER_SITE_OTHER): Payer: Self-pay | Admitting: Family Medicine

## 2021-03-29 ENCOUNTER — Ambulatory Visit (INDEPENDENT_AMBULATORY_CARE_PROVIDER_SITE_OTHER): Payer: Medicare Other | Admitting: Family Medicine

## 2021-03-29 ENCOUNTER — Other Ambulatory Visit: Payer: Self-pay

## 2021-03-29 VITALS — BP 113/67 | HR 50 | Temp 98.1°F | Ht 67.0 in | Wt 191.0 lb

## 2021-03-29 DIAGNOSIS — E7849 Other hyperlipidemia: Secondary | ICD-10-CM

## 2021-03-29 DIAGNOSIS — Z6833 Body mass index (BMI) 33.0-33.9, adult: Secondary | ICD-10-CM | POA: Diagnosis not present

## 2021-03-29 DIAGNOSIS — E559 Vitamin D deficiency, unspecified: Secondary | ICD-10-CM | POA: Diagnosis not present

## 2021-03-29 DIAGNOSIS — Z0000 Encounter for general adult medical examination without abnormal findings: Secondary | ICD-10-CM | POA: Diagnosis not present

## 2021-03-29 DIAGNOSIS — E669 Obesity, unspecified: Secondary | ICD-10-CM | POA: Diagnosis not present

## 2021-03-29 DIAGNOSIS — E7800 Pure hypercholesterolemia, unspecified: Secondary | ICD-10-CM

## 2021-03-29 DIAGNOSIS — R7303 Prediabetes: Secondary | ICD-10-CM | POA: Diagnosis not present

## 2021-03-30 LAB — COMPREHENSIVE METABOLIC PANEL
ALT: 31 IU/L (ref 0–44)
AST: 19 IU/L (ref 0–40)
Albumin/Globulin Ratio: 2 (ref 1.2–2.2)
Albumin: 4.4 g/dL (ref 3.8–4.8)
Alkaline Phosphatase: 91 IU/L (ref 44–121)
BUN/Creatinine Ratio: 28 — ABNORMAL HIGH (ref 10–24)
BUN: 27 mg/dL (ref 8–27)
Bilirubin Total: 0.5 mg/dL (ref 0.0–1.2)
CO2: 22 mmol/L (ref 20–29)
Calcium: 9.3 mg/dL (ref 8.6–10.2)
Chloride: 103 mmol/L (ref 96–106)
Creatinine, Ser: 0.96 mg/dL (ref 0.76–1.27)
Globulin, Total: 2.2 g/dL (ref 1.5–4.5)
Glucose: 102 mg/dL — ABNORMAL HIGH (ref 65–99)
Potassium: 4 mmol/L (ref 3.5–5.2)
Sodium: 142 mmol/L (ref 134–144)
Total Protein: 6.6 g/dL (ref 6.0–8.5)
eGFR: 87 mL/min/{1.73_m2} (ref >59)

## 2021-03-30 LAB — LIPID PANEL WITH LDL/HDL RATIO
Cholesterol, Total: 243 mg/dL — ABNORMAL HIGH (ref 100–199)
HDL: 50 mg/dL (ref >39)
LDL Chol Calc (NIH): 175 mg/dL — ABNORMAL HIGH (ref 0–99)
LDL/HDL Ratio: 3.5 ratio (ref 0.0–3.6)
Triglycerides: 101 mg/dL (ref 0–149)
VLDL Cholesterol Cal: 18 mg/dL (ref 5–40)

## 2021-03-30 LAB — VITAMIN D 25 HYDROXY (VIT D DEFICIENCY, FRACTURES): Vit D, 25-Hydroxy: 42 ng/mL (ref 30.0–100.0)

## 2021-03-30 LAB — PSA: Prostate Specific Ag, Serum: 1.2 ng/mL (ref 0.0–4.0)

## 2021-03-30 LAB — HEMOGLOBIN A1C
Est. average glucose Bld gHb Est-mCnc: 111 mg/dL
Hgb A1c MFr Bld: 5.5 % (ref 4.8–5.6)

## 2021-03-30 LAB — INSULIN, RANDOM: INSULIN: 9 u[IU]/mL (ref 2.6–24.9)

## 2021-04-06 NOTE — Progress Notes (Signed)
Chief Complaint:   OBESITY Knowledge is here to discuss his progress with his obesity treatment plan along with follow-up of his obesity related diagnoses. Haytham is on keeping a food journal and adhering to recommended goals of 1700 calories and 90 grams of protein daily and states he is following his eating plan approximately 75% of the time. Deondrea states he is walking an average of 10,000 steps daily 7 times per week.  Today's visit was #: 17 Starting weight: 215 lbs Starting date: 01/28/2020 Today's weight: 191 lbs Today's date: 03/29/2021 Total lbs lost to date: 24 Total lbs lost since last in-office visit: 2  Interim History: Christifer continues to do well with weight loss. He is in the process of moving and this has been stressful but he has remained mindful of his food options. He will be leaving the area and this will be our last visit.  Subjective:   1. Pre-diabetes Darion is working on diet and exercise, and he is due for labs. He is not on metformin.  2. Vitamin D deficiency Hao is on Vit D, and he denies nausea or vomiting.  3. Hyperlipidemia, pure Kensley is working on diet and weight loss, and he is due for labs. He is not on statin as all statins give him muscle pain.  4. Routine adult health maintenance Lonie requests a PSA be done today, and results be sent to his primary care physician. He has a history of prostate problems. This test will be for routine maintenance per his primary care physician.  Assessment/Plan:   1. Pre-diabetes Jerardo will continue to work on weight loss, diet, exercise, and decreasing simple carbohydrates to help decrease the risk of diabetes. We will check labs today.  - Comprehensive metabolic panel - Hemoglobin A1c - Insulin, random  2. Vitamin D deficiency Low Vitamin D level contributes to fatigue and are associated with obesity, breast, and colon cancer. We will check labs today. Oluwafemi will follow-up for routine testing of  Vitamin D, at least 2-3 times per year to avoid over-replacement.  - VITAMIN D 25 Hydroxy (Vit-D Deficiency, Fractures)  3. Hyperlipidemia, pure Cardiovascular risk and specific lipid/LDL goals reviewed. We discussed several lifestyle modifications today. We will check labs today. Stacy will continue to work on diet, exercise and weight loss efforts. Orders and follow up as documented in patient record.   - Lipid Panel With LDL/HDL Ratio  4. Routine adult health maintenance We will check PSA today, and will send results to [facility] primary care physician. - PSA  5. Obesity with current BMI of 29.9 Brailyn is currently in the action stage of change. As such, his goal is to continue with weight loss efforts. He has agreed to keeping a food journal and adhering to recommended goals of 1700 calories and 90 grams of protein daily.   Exercise goals: As is.  Behavioral modification strategies: meal planning and cooking strategies.  Trea has agreed to follow-up with our clinic as needed, as he is moving. He was informed of the importance of frequent follow-up visits to maximize his success with intensive lifestyle modifications for his multiple health conditions.   Esco was informed we would discuss his lab results at his next visit unless there is a critical issue that needs to be addressed sooner. Gloria agreed to keep his next visit at the agreed upon time to discuss these results.  Objective:   Blood pressure 113/67, pulse (!) 50, temperature 98.1 F (36.7 C), height 5'  7" (1.702 m), weight 191 lb (86.6 kg), SpO2 98 %. Body mass index is 29.91 kg/m.  General: Cooperative, alert, well developed, in no acute distress. HEENT: Conjunctivae and lids unremarkable. Cardiovascular: Regular rhythm.  Lungs: Normal work of breathing. Neurologic: No focal deficits.   Lab Results  Component Value Date   CREATININE 0.96 03/29/2021   BUN 27 03/29/2021   NA 142 03/29/2021   K 4.0 03/29/2021    CL 103 03/29/2021   CO2 22 03/29/2021   Lab Results  Component Value Date   ALT 31 03/29/2021   AST 19 03/29/2021   ALKPHOS 91 03/29/2021   BILITOT 0.5 03/29/2021   Lab Results  Component Value Date   HGBA1C 5.5 03/29/2021   HGBA1C 5.5 11/04/2020   HGBA1C 5.5 07/07/2020   HGBA1C 5.6 01/28/2020   Lab Results  Component Value Date   INSULIN 9.0 03/29/2021   INSULIN 11.3 11/04/2020   INSULIN 7.8 07/07/2020   INSULIN 7.8 01/28/2020   Lab Results  Component Value Date   TSH 0.956 01/28/2020   Lab Results  Component Value Date   CHOL 243 (H) 03/29/2021   HDL 50 03/29/2021   LDLCALC 175 (H) 03/29/2021   TRIG 101 03/29/2021   Lab Results  Component Value Date   WBC 6.2 07/07/2020   HGB 14.9 07/07/2020   HCT 45.8 07/07/2020   MCV 90 07/07/2020   PLT 269 07/07/2020   No results found for: IRON, TIBC, FERRITIN  Obesity Behavioral Intervention:   Approximately 15 minutes were spent on the discussion below.  ASK: We discussed the diagnosis of obesity with [name] today and Michelle agreed to give [place] permission to discuss obesity behavioral modification therapy today.  ASSESS: Seanpatrick has the diagnosis of obesity and his BMI today is 29.91. Jong is in the action stage of change.   ADVISE: Denis was educated on the multiple health risks of obesity as well as the benefit of weight loss to improve his health. He was advised of the need for long term treatment and the importance of lifestyle modifications to improve his current health and to decrease his risk of future health problems.  AGREE: Multiple dietary modification options and treatment options were discussed and Arber agreed to follow the recommendations documented in the above note.  ARRANGE: Steed was educated on the importance of frequent visits to treat obesity as outlined per CMS and USPSTF guidelines and agreed to schedule his next follow up appointment today.  Attestation Statements:   Reviewed by  clinician on day of visit: allergies, medications, problem list, medical history, surgical history, family history, social history, and previous encounter notes.   I, Burt Knack, am acting as transcriptionist for Quillian Quince, MD.  I have reviewed the above documentation for accuracy and completeness, and I agree with the above. -  Quillian Quince, MD

## 2021-05-05 IMAGING — MR MR LUMBAR SPINE W/O CM
4 of 5 series · 26 of 48 positions shown · non-contrast
Comparison: 11/24/2014

CLINICAL DATA: Chronic low back pain radiating into both anterior
thighs. Bilateral leg weakness. History of a T12 compression
fracture.

EXAM:
MRI LUMBAR SPINE WITHOUT CONTRAST
TECHNIQUE: Multiplanar, multisequence MR imaging of the lumbar spine was
performed. No intravenous contrast was administered.

[Series 4: T2 post-contrast · sagittal · 4.0mm · 0.55mm/px · 6 of 19 slices shown]
[im 1/19]
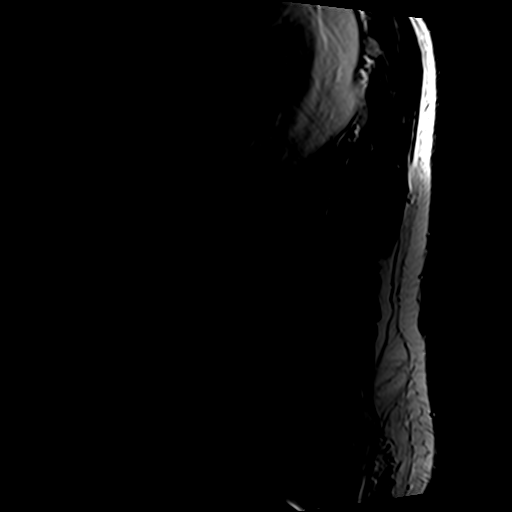
[im 4/19]
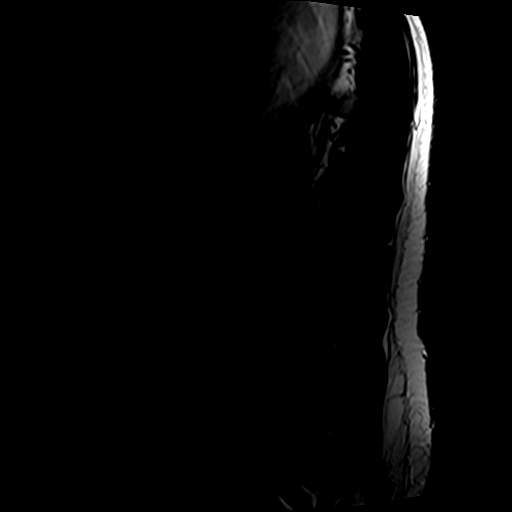
[im 8/19]
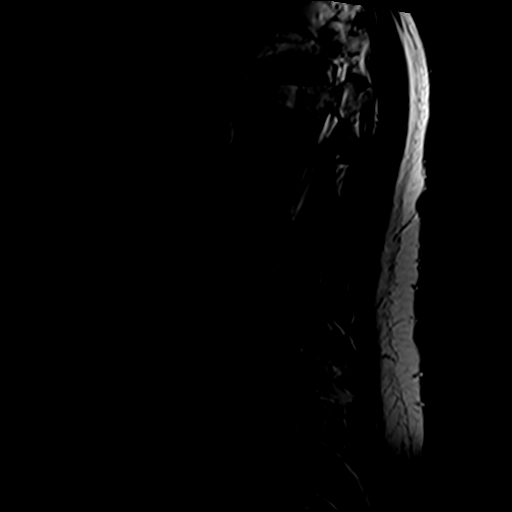
[im 11/19]
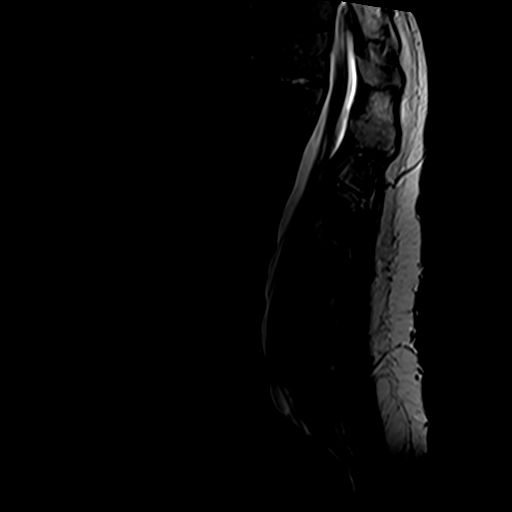
[im 15/19]
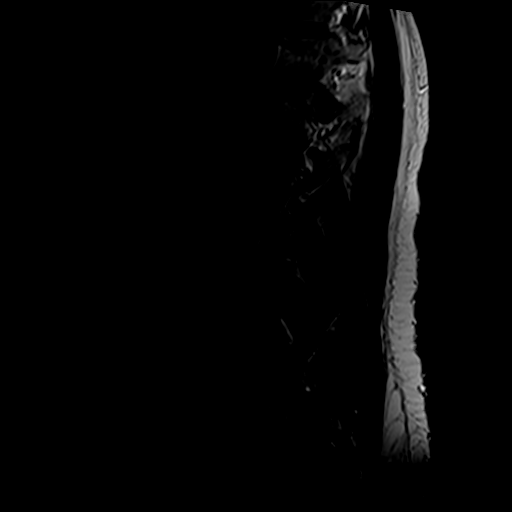
[im 19/19]
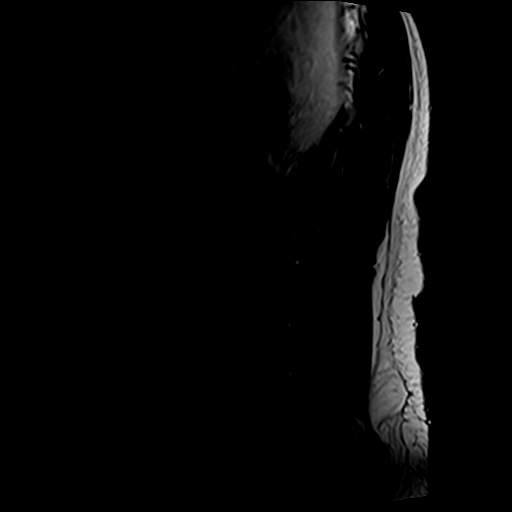

[Series 6: T1 · sagittal · 4.0mm · 0.55mm/px · 6 of 19 slices shown (1 of 2)]
[im 1/19]
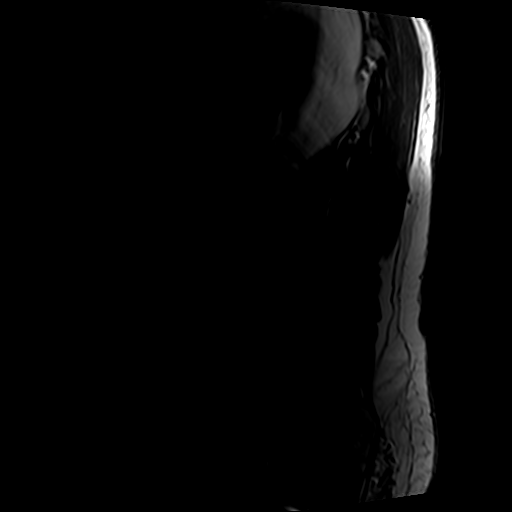
[im 4/19]
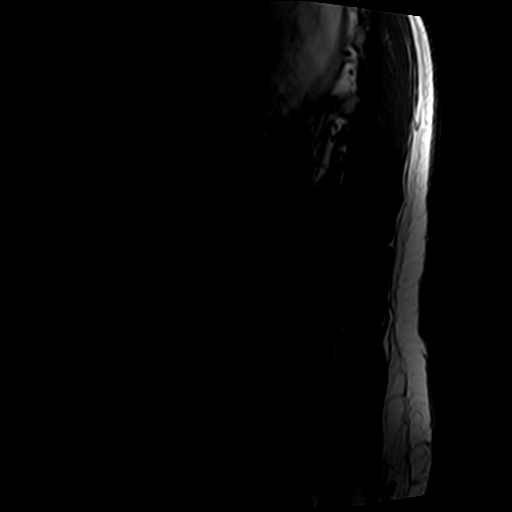
[im 8/19]
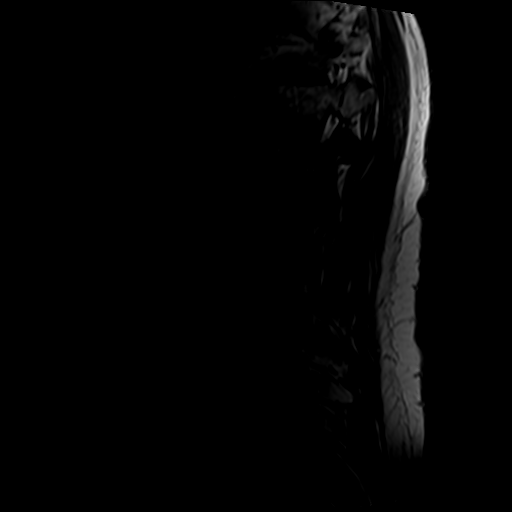
[im 11/19]
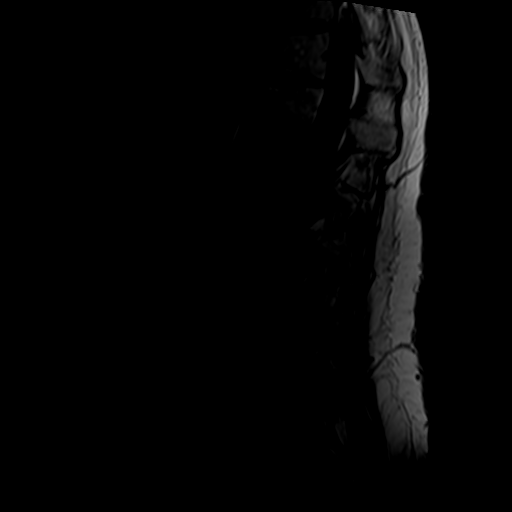
[im 15/19]
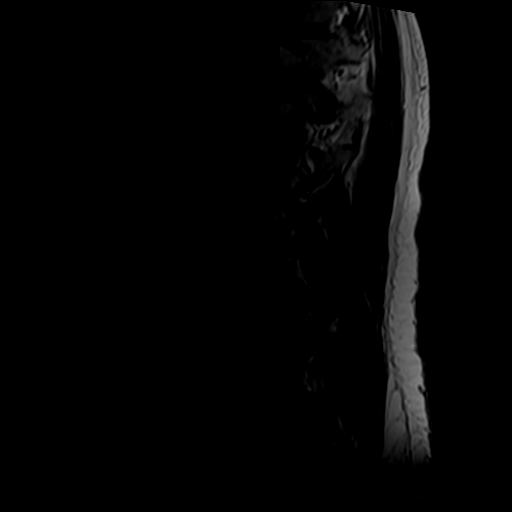
[im 19/19]
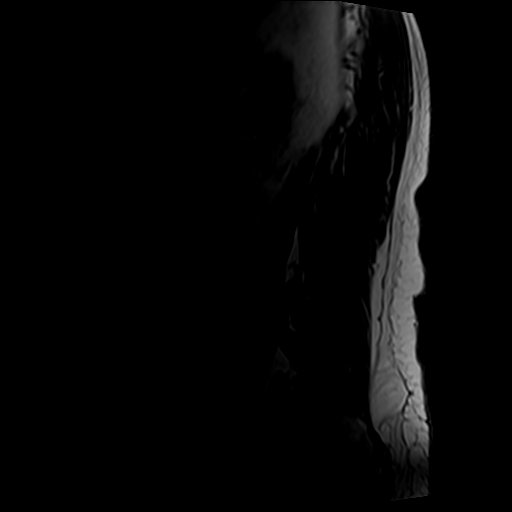

[Series 7: T2 · axial · 4.0mm · 0.70mm/px · z∈[-166,+91]mm · 9 of 50 slices shown]
[im 1/50]
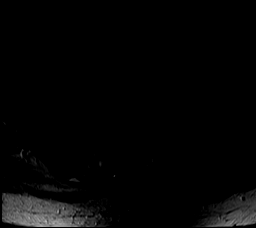
[im 8/50]
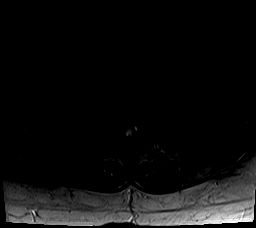
[im 15/50]
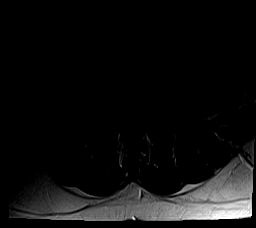
[im 22/50]
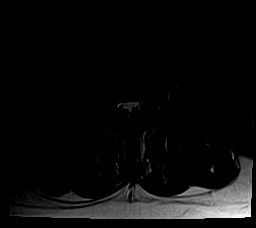
[im 25/50]
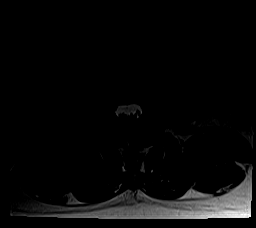
[im 29/50]
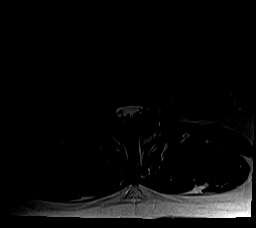
[im 36/50]
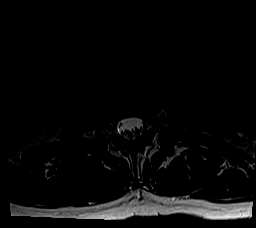
[im 43/50]
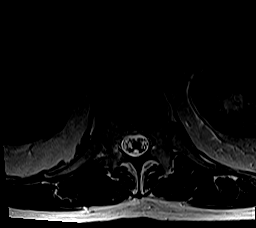
[im 50/50]
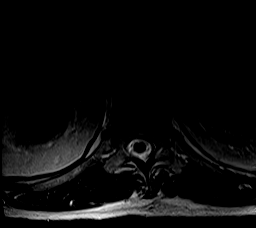

[Series 8: T1 · axial · 4.0mm · 0.35mm/px · z∈[-166,+46]mm · 5 of 48 slices shown (2 of 2)]
[im 1/48]
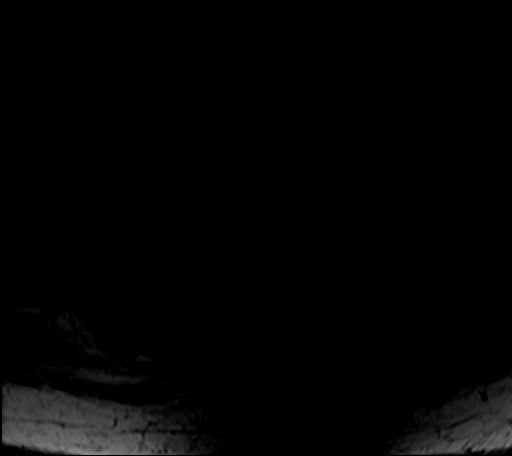
[im 7/48]
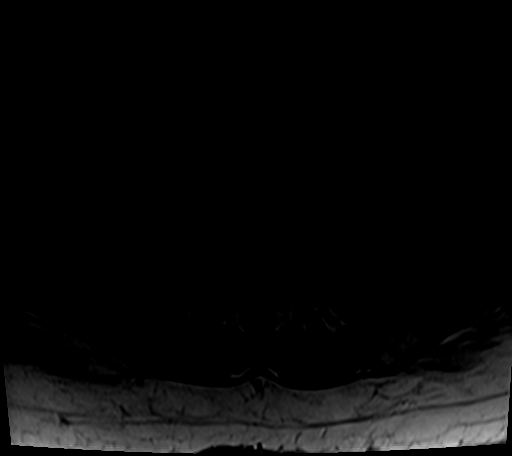
[im 14/48]
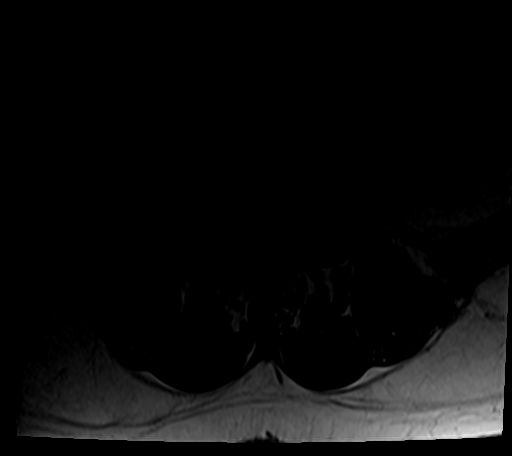
[im 24/48]
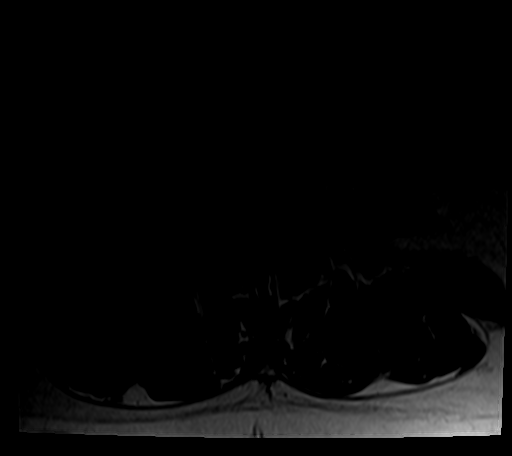
[im 41/48]
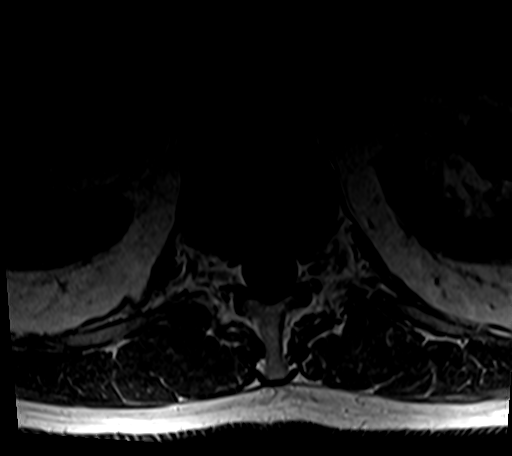

[26 of 48 positions shown; findings below may reference images not displayed]

FINDINGS: Segmentation:  Standard.

Alignment:  Normal.

Vertebrae: Chronic T12 compression fracture with moderate anterior
vertebral body height loss. No acute fracture, suspicious osseous
lesion, or significant marrow edema.

Conus medullaris and cauda equina: Conus extends to the T12-L1
level. Conus and cauda equina appear normal.

Paraspinal and other soft tissues: Unremarkable.

Disc levels:

Disc desiccation throughout the lumbar spine with exception of
L5-S1. Preserved disc space heights.

T12-L1 and L1-2: Negative.

L2-3: New minimal disc bulging without stenosis.

L3-4: New minimal disc bulging and minimal facet and ligamentum
flavum hypertrophy result in borderline bilateral lateral recess
stenosis without spinal or neural foraminal stenosis.

L4-5: Disc bulging, congenitally short pedicles, and moderate facet
and ligamentum flavum hypertrophy result in progressive moderate
spinal stenosis, moderate bilateral lateral recess stenosis, and
mild-to-moderate bilateral neural foraminal stenosis. The L5 nerve
roots may be affected in the lateral recesses.

L5-S1: Mild facet hypertrophy without disc herniation or stenosis.
IMPRESSION: 1. Progressive moderate multifactorial spinal stenosis and
mild-to-moderate neural foraminal stenosis at L4-5.
2. New minimal disc bulging at L2-3 and L3-4 without significant
stenosis.
3. Chronic T12 compression fracture.

## 2021-06-01 DIAGNOSIS — M5416 Radiculopathy, lumbar region: Secondary | ICD-10-CM | POA: Diagnosis not present

## 2021-06-08 ENCOUNTER — Other Ambulatory Visit (INDEPENDENT_AMBULATORY_CARE_PROVIDER_SITE_OTHER): Payer: Self-pay | Admitting: Family Medicine

## 2021-06-08 DIAGNOSIS — E559 Vitamin D deficiency, unspecified: Secondary | ICD-10-CM

## 2021-06-08 NOTE — Telephone Encounter (Signed)
Dr.Beasley 

## 2021-09-08 ENCOUNTER — Telehealth: Payer: Self-pay | Admitting: Interventional Cardiology

## 2021-09-08 NOTE — Telephone Encounter (Signed)
Pt aware of response and has an understanding of the reason why ./cy

## 2021-09-08 NOTE — Telephone Encounter (Signed)
Second generation drug eluting stents, like the one he had in 2009,  were later found to have some increased late stent thrombosis risk. I elected to keep him on Plavix as long as he was not having bleeding issues.   His new doctor may have a different preference.    JV

## 2021-09-08 NOTE — Telephone Encounter (Signed)
Pt has moved to [facility] and seen new cardiologists yesterday and pt stated new cardiologists was perplexed as why pt was still on Plavix after having stents in 2009 Informed pt that pt's may be on this med long term  but need to be on at least a year post stent  Pt wanted Dr Hoyle Barr opinion if was okay to stop or not Will forward to DR Brazil for review and recommendations ./cy

## 2021-09-08 NOTE — Telephone Encounter (Signed)
New Message:     Patient says he needs to talk to the nurse  about his Plavix. He says it is rather complicated and he would discuss it with the nurse.

## 2022-07-27 ENCOUNTER — Encounter (INDEPENDENT_AMBULATORY_CARE_PROVIDER_SITE_OTHER): Payer: Self-pay

## 2023-06-07 NOTE — Telephone Encounter (Signed)
Closing encounter
